# Patient Record
Sex: Male | Born: 1985 | Race: White | Hispanic: No | Marital: Married | Smoking: Never smoker
Health system: Southern US, Community
[De-identification: ages and names within clinical notes are randomized; demographics above are authoritative.]

## PROBLEM LIST (undated history)

## (undated) DIAGNOSIS — K760 Fatty (change of) liver, not elsewhere classified: Secondary | ICD-10-CM

## (undated) DIAGNOSIS — I10 Essential (primary) hypertension: Secondary | ICD-10-CM

## (undated) DIAGNOSIS — F410 Panic disorder [episodic paroxysmal anxiety] without agoraphobia: Secondary | ICD-10-CM

## (undated) DIAGNOSIS — K5100 Ulcerative (chronic) pancolitis without complications: Secondary | ICD-10-CM

## (undated) DIAGNOSIS — K529 Noninfective gastroenteritis and colitis, unspecified: Secondary | ICD-10-CM

## (undated) DIAGNOSIS — E119 Type 2 diabetes mellitus without complications: Secondary | ICD-10-CM

## (undated) HISTORY — DX: Morbid (severe) obesity due to excess calories: E66.01

## (undated) HISTORY — DX: Panic disorder (episodic paroxysmal anxiety): F41.0

## (undated) HISTORY — DX: Type 2 diabetes mellitus without complications: E11.9

## (undated) HISTORY — PX: NO PAST SURGERIES: SHX2092

## (undated) HISTORY — DX: Noninfective gastroenteritis and colitis, unspecified: K52.9

## (undated) HISTORY — DX: Ulcerative (chronic) pancolitis without complications: K51.00

## (undated) HISTORY — DX: Fatty (change of) liver, not elsewhere classified: K76.0

---

## 2014-08-09 DIAGNOSIS — Z79899 Other long term (current) drug therapy: Secondary | ICD-10-CM | POA: Insufficient documentation

## 2015-03-28 DIAGNOSIS — F4001 Agoraphobia with panic disorder: Secondary | ICD-10-CM | POA: Insufficient documentation

## 2015-03-28 DIAGNOSIS — I10 Essential (primary) hypertension: Secondary | ICD-10-CM | POA: Insufficient documentation

## 2016-01-22 ENCOUNTER — Ambulatory Visit: Payer: Self-pay | Admitting: Orthopedic Surgery

## 2016-01-29 ENCOUNTER — Ambulatory Visit: Payer: Self-pay | Admitting: Orthopedic Surgery

## 2016-01-29 NOTE — H&P (Signed)
[name] is an 30 y.o. male.   Chief Complaint: back and L leg pain HPI: The patient is a 30 year old male who presents today for follow up of their back. The patient is being followed for their low back symptoms. They are now 9 1/2 months out from when symptoms began. Symptoms reported today include: pain and foot pain (tingling after sitting in the left foot), while the patient does not report symptoms of: numbness. Current treatment includes: activity modification and Robaxin. The patient presents today following MRI.  He follows up with persistent pain. It radiates into the left foot and leg. He is unable to exercise, take long strides. 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 He is mainly in a sedentary position. This has been going on for over nine-and-a-half months.  Review of systems is negative for fevers, chest pain, shortness of breath, unexplained recent weight loss, loss of bowel or bladder function, burning with urination, joint swelling, rashes, weakness or numbness, difficulty with balance, easy bruising, excessive thirst or frequent urination.  No past medical history on file.  No past surgical history on file.  No family history on file. Social History:  has no tobacco, alcohol, and drug history on file.  Allergies: Allergies not on file   (Not in a hospital admission)  No results found for this or any previous visit (from the past 48 hour(s)). No results found.  Review of Systems  Constitutional: Negative.   HENT: Negative.   Eyes: Negative.   Respiratory: Negative.   Cardiovascular: Negative.   Gastrointestinal: Negative.   Genitourinary: Negative.   Musculoskeletal: Positive for back pain.  Skin: Negative.   Neurological: Positive for sensory change and focal weakness.  Psychiatric/Behavioral: Negative.     There were no vitals taken for this visit. Physical Exam  Constitutional: He is oriented to person, place, and time. He appears well-developed.   HENT:  Head: Normocephalic.  Eyes: Pupils are equal, round, and reactive to light.  Neck: Normal range of motion.  Cardiovascular: Normal rate.   Respiratory: Effort normal.  GI: Soft.  Musculoskeletal:  He is upright, in mild distress. Mood and affect is appropriate. He is tender in the left proximal gluteus. His straight leg raise is buttock, thigh, and calf pain on the left. Straight leg raise on the right produces left buttock pain. EHL is 4+/5 on the left compared to the right. He has limited extension.  Lumbar spine exam reveals no evidence of soft tissue swelling, deformity or skin ecchymosis. On palpation there is no tenderness of the lumbar spine. No flank pain with percussion. The abdomen is soft and nontender. Nontender over the trochanters. No cellulitis or lymphadenopathy.  Motor is 5/5 including EHL, tibialis anterior, plantar flexion, quadriceps and hamstrings. Patient is normoreflexic. There is no Babinski or clonus. Sensory exam is intact to light touch. Patient has good distal pulses. No DVT. No pain and normal range of motion without instability of the hips, knees and ankles.  Neurological: He is alert and oriented to person, place, and time.  Skin: Skin is warm and dry.  Psychiatric: He has a normal mood and affect.    MRI demonstrates a disc herniation as a lowest disc space, which is labeled L4-5 as he has a sacralised lumbar vertebral body. He has congenital spinal stenosis as well, disc degeneration and a small protrusion at L3-4.  Assessment/Plan 1. 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 2. Underlying congenital spinal stenosis. 3. 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 4. Elevated BMI.  I had an extensive discussion with Mr. Pischke concerning his current pathology, relevant anatomy, and treatment  options. Given that he has failed conservative treatment in the presence of a neurologic deficit, significant negative affect of his activities of daily living, we discussed a micro lumbar decompression at L4-5.  I had an extensive discussion of the risks and benefits of the lumbar decompression with the patient including bleeding, infection, damage to neurovascular structures, epidural fibrosis, CSF leak requiring repair. We also discussed increase in pain, adjacent segment disease, recurrent disc herniation, need for future surgery including repeat decompression and/or fusion. We also discussed risks of postoperative hematoma, paralysis, anesthetic complications including DVT, PE, death, cardiopulmonary dysfunction. In addition, the perioperative and postoperative courses were discussed in detail including the rehabilitative time and return to functional activity and work. I provided the patient with an illustrated handout and utilized the appropriate surgical models.  We discussed overnight in the hospital two weeks for suture removal, return to work discussion at two weeks. No history of DVT, PE, or MRSA exposure. He is able to take Kefzol. If he has any change in his symptoms in the interim, he is to call. We discussed this in extensive detail. We reviewed the MRI and his radiographs. He does have disc degeneration on his radiographs. No instability in flexion and extension. We discussed disc pressure management following that specifically the incidence of recurrent disc herniation and progressive spinal stenosis.  Plan microlumbar decompression L4-5  Cecilie Kicks., PA-C for Dr. Tonita Cong 01/29/2016, 3:42 PM

## 2016-02-08 NOTE — Patient Instructions (Addendum)
[name]  02/08/2016   Your procedure is scheduled on: 02/14/16  Report to [facility] Main  Entrance take [place]  elevators to 3rd floor to  [place] at  [place] AM.  Call this number if you have problems the morning of surgery [phone]   Remember: ONLY 1 PERSON MAY GO WITH YOU TO SHORT STAY TO GET  READY MORNING OF [place].  Do not eat food or drink liquids :After Midnight.     Take these med icines the morning of surgery with A SIP OF WATER: Lexapro, Robaxin  DO NOT TAKE ANY DIABETIC MEDICATIONS DAY OF YOUR SURGERY                               You may not have any metal on your body including hair pins and              piercings  Do not wear jewelry, make-up, lotions, powders or perfumes, deodorant             Do not wear nail polish.  Do not shave  48 hours prior to surgery.              Men may shave face and neck.   Do not bring valuables to the hospital. [place].  Contacts, dentures or bridgework may not be worn into surgery.  Leave suitcase in the car. After surgery it may be brought to your room.               - Preparing for Surgery Before surgery, you can play an important role.  Because skin is not sterile, your skin needs to be as free of germs as possible.  You can reduce the number of germs on your skin by washing with CHG (chlorahexidine gluconate) soap before surgery.  CHG is an antiseptic cleaner which kills germs and bonds with the skin to continue killing germs even after washing. Please DO NOT use if you have an allergy to CHG or antibacterial soaps.  If your skin becomes reddened/irritated stop using the CHG and inform your nurse when you arrive at Short Stay. Do not shave (including legs and underarms) for at least 48 hours prior to the first CHG shower.  You may shave your face/neck. Please follow these instructions carefully:  1.  Shower with CHG Soap  the night before surgery and the  morning of Surgery.  2.  If you choose to wash your hair, wash your hair first as usual with your  normal  shampoo.  3.  After you shampoo, rinse your hair and body thoroughly to remove the  shampoo.                           4.  Use CHG as you would any other liquid soap.  You can apply chg directly  to the skin and wash                       Gently with a scrungie or clean washcloth.  5.  Apply the CHG Soap to your body ONLY FROM THE NECK DOWN.   Do not use on  face/ open                           Wound or open sores. Avoid contact with eyes, ears mouth and genitals (private parts).                       Wash face,  Genitals (private parts) with your normal soap.             6.  Wash thoroughly, paying special attention to the area where your surgery  will be performed.  7.  Thoroughly rinse your body with warm water from the neck down.  8.  DO NOT shower/wash with your normal soap after using and rinsing off  the CHG Soap.                9.  Pat yourself dry with a clean towel.            10.  Wear clean pajamas.            11.  Place clean sheets on your bed the night of your first shower and do not  sleep with pets. Day of Surgery : Do not apply any lotions/deodorants the morning of surgery.  Please wear clean clothes to the hospital/surgery center.  FAILURE TO FOLLOW THESE INSTRUCTIONS MAY RESULT IN THE CANCELLATION OF YOUR SURGERY PATIENT SIGNATURE_________________________________  NURSE SIGNATURE__________________________________  ________________________________________________________________________   Adam Phenix  An incentive spirometer is a tool that can help keep your lungs clear and active. This tool measures how well you are filling your lungs with each breath. Taking long deep breaths may help reverse or decrease the chance of developing breathing (pulmonary) problems (especially infection) following:  A long period of time when  you are unable to move or be active. BEFORE THE PROCEDURE   If the spirometer includes an indicator to show your best effort, your nurse or respiratory therapist will set it to a desired goal.  If possible, sit up straight or lean slightly forward. Try not to slouch.  Hold the incentive spirometer in an upright position. INSTRUCTIONS FOR USE  1. Sit on the edge of your bed if possible, or sit up as far as you can in bed or on a chair. 2. Hold the incentive spirometer in an upright position. 3. Breathe out normally. 4. Place the mouthpiece in your mouth and seal your lips tightly around it. 5. Breathe in slowly and as deeply as possible, raising the piston or the ball toward the top of the column. 6. Hold your breath for 3-5 seconds or for as long as possible. Allow the piston or ball to fall to the bottom of the column. 7. Remove the mouthpiece from your mouth and breathe out normally. 8. Rest for a few seconds and repeat Steps 1 through 7 at least 10 times every 1-2 hours when you are awake. Take your time and take a few normal breaths between deep breaths. 9. The spirometer may include an indicator to show your best effort. Use the indicator as a goal to work toward during each repetition. 10. After each set of 10 deep breaths, practice coughing to be sure your lungs are clear. If you have an incision (the cut made at the time of surgery), support your incision when coughing by placing a pillow or rolled up towels firmly against it. Once you are able to get out of bed, walk around indoors  and cough well. You may stop using the incentive spirometer when instructed by your caregiver.  RISKS AND COMPLICATIONS  Take your time so you do not get dizzy or light-headed.  If you are in pain, you may need to take or ask for pain medication before doing incentive spirometry. It is harder to take a deep breath if you are having pain. AFTER USE  Rest and breathe slowly and easily.  It can be  helpful to keep track of a log of your progress. Your caregiver can provide you with a simple table to help with this. If you are using the spirometer at home, follow these instructions: [place] IF:   You are having difficultly using the spirometer.  You have trouble using the spirometer as often as instructed.  Your pain medication is not giving enough relief while using the spirometer.  You develop fever of 100.5 F (38.1 C) or higher. SEEK IMMEDIATE MEDICAL CARE IF:   You cough up bloody sputum that had not been present before.  You develop fever of 102 F (38.9 C) or greater.  You develop worsening pain at or near the incision site. MAKE SURE YOU:   Understand these instructions.  Will watch your condition.  Will get help right away if you are not doing well or get worse. Document Released: 11/11/2006 Document Revised: 09/23/2011 Document Reviewed: 01/12/2007 [facility] Patient Information 2014 [place], [place].   ________________________________________________________________________

## 2016-02-09 ENCOUNTER — Ambulatory Visit (HOSPITAL_COMMUNITY)
Admission: RE | Admit: 2016-02-09 | Discharge: 2016-02-09 | Disposition: A | Discharge status: Home / Self Care | Payer: 59 | Source: Ambulatory Visit | Attending: Orthopedic Surgery | Admitting: Orthopedic Surgery

## 2016-02-09 ENCOUNTER — Encounter (HOSPITAL_COMMUNITY)
Admission: RE | Admit: 2016-02-09 | Discharge: 2016-02-09 | Disposition: A | Discharge status: Home / Self Care | Payer: 59 | Source: Ambulatory Visit | Attending: Specialist | Admitting: Specialist

## 2016-02-09 ENCOUNTER — Encounter (HOSPITAL_COMMUNITY): Payer: Self-pay

## 2016-02-09 DIAGNOSIS — I10 Essential (primary) hypertension: Secondary | ICD-10-CM | POA: Insufficient documentation

## 2016-02-09 DIAGNOSIS — M5126 Other intervertebral disc displacement, lumbar region: Secondary | ICD-10-CM | POA: Diagnosis present

## 2016-02-09 HISTORY — DX: Essential (primary) hypertension: I10

## 2016-02-09 LAB — ABO/RH: ABO/RH(D): A POS

## 2016-02-09 LAB — CBC
HEMATOCRIT: 42.9 % (ref 39.0–52.0)
HEMOGLOBIN: 15.2 g/dL (ref 13.0–17.0)
MCH: 29.6 pg (ref 26.0–34.0)
MCHC: 35.4 g/dL (ref 30.0–36.0)
MCV: 83.6 fL (ref 78.0–100.0)
Platelets: 325 10*3/uL (ref 150–400)
RBC: 5.13 MIL/uL (ref 4.22–5.81)
RDW: 12.2 % (ref 11.5–15.5)
WBC: 7.9 10*3/uL (ref 4.0–10.5)

## 2016-02-09 LAB — BASIC METABOLIC PANEL
ANION GAP: 9 (ref 5–15)
BUN: 17 mg/dL (ref 6–20)
CHLORIDE: 103 mmol/L (ref 101–111)
CO2: 24 mmol/L (ref 22–32)
Calcium: 9.5 mg/dL (ref 8.9–10.3)
Creatinine, Ser: 0.85 mg/dL (ref 0.61–1.24)
GFR calc Af Amer: >60 mL/min (ref >60)
GLUCOSE: 97 mg/dL (ref 65–99)
POTASSIUM: 4.1 mmol/L (ref 3.5–5.1)
Sodium: 136 mmol/L (ref 135–145)

## 2016-02-09 LAB — SURGICAL PCR SCREEN
MRSA, PCR: NEGATIVE
STAPHYLOCOCCUS AUREUS: NEGATIVE

## 2016-02-13 MED ORDER — DEXTROSE 5 % IV SOLN
3.0000 g | INTRAVENOUS | Status: AC
  Administered 2016-02-14: 3 g via INTRAVENOUS
  Filled 2016-02-13: qty 3

## 2016-02-13 NOTE — Anesthesia Preprocedure Evaluation (Signed)
Anesthesia Evaluation  Patient identified by MRN, date of birth, ID band Patient awake    Reviewed: Allergy & Precautions, H&P , NPO status , Patient's Chart, lab work & pertinent test results  Airway Mallampati: II  TM Distance: >3 FB Neck ROM: full    Dental no notable dental hx. (+) Dental Advisory Given, Teeth Intact   Pulmonary neg pulmonary ROS,    Pulmonary exam normal breath sounds clear to auscultation       Cardiovascular hypertension, On Medications Normal cardiovascular exam Rhythm:regular Rate:Normal     Neuro/Psych negative neurological ROS  negative psych ROS   GI/Hepatic negative GI ROS, Neg liver ROS,   Endo/Other  negative endocrine ROS  Renal/GU negative Renal ROS  negative genitourinary   Musculoskeletal   Abdominal   Peds  Hematology negative hematology ROS (+)   Anesthesia Other Findings   Reproductive/Obstetrics negative OB ROS                             Anesthesia Physical Anesthesia Plan  ASA: II  Anesthesia Plan: General   Post-op Pain Management:    Induction: Intravenous  Airway Management Planned: Oral ETT  Additional Equipment:   Intra-op Plan:   Post-operative Plan: Extubation in OR  Informed Consent: I have reviewed the patients History and Physical, chart, labs and discussed the procedure including the risks, benefits and alternatives for the proposed anesthesia with the patient or authorized representative who has indicated his/her understanding and acceptance.   Dental Advisory Given  Plan Discussed with: CRNA  Anesthesia Plan Comments:         Anesthesia Quick Evaluation

## 2016-02-14 ENCOUNTER — Ambulatory Visit (HOSPITAL_COMMUNITY): Payer: 59

## 2016-02-14 ENCOUNTER — Ambulatory Visit (HOSPITAL_COMMUNITY): Payer: 59 | Admitting: Anesthesiology

## 2016-02-14 ENCOUNTER — Ambulatory Visit (HOSPITAL_COMMUNITY)
Admission: RE | Admit: 2016-02-14 | Discharge: 2016-02-15 | Disposition: A | Discharge status: Home / Self Care | Payer: 59 | Source: Ambulatory Visit | Attending: Specialist | Admitting: Specialist

## 2016-02-14 ENCOUNTER — Encounter (HOSPITAL_COMMUNITY)
Admission: RE | Disposition: A | Discharge status: Home / Self Care | Payer: Self-pay | Source: Ambulatory Visit | Attending: Specialist

## 2016-02-14 ENCOUNTER — Encounter (HOSPITAL_COMMUNITY): Payer: Self-pay | Admitting: *Deleted

## 2016-02-14 DIAGNOSIS — M5116 Intervertebral disc disorders with radiculopathy, lumbar region: Secondary | ICD-10-CM | POA: Diagnosis present

## 2016-02-14 DIAGNOSIS — I10 Essential (primary) hypertension: Secondary | ICD-10-CM | POA: Diagnosis not present

## 2016-02-14 DIAGNOSIS — Q7649 Other congenital malformations of spine, not associated with scoliosis: Secondary | ICD-10-CM | POA: Insufficient documentation

## 2016-02-14 DIAGNOSIS — Z79899 Other long term (current) drug therapy: Secondary | ICD-10-CM | POA: Insufficient documentation

## 2016-02-14 DIAGNOSIS — M5126 Other intervertebral disc displacement, lumbar region: Secondary | ICD-10-CM

## 2016-02-14 DIAGNOSIS — Z419 Encounter for procedure for purposes other than remedying health state, unspecified: Secondary | ICD-10-CM

## 2016-02-14 DIAGNOSIS — F410 Panic disorder [episodic paroxysmal anxiety] without agoraphobia: Secondary | ICD-10-CM | POA: Diagnosis not present

## 2016-02-14 DIAGNOSIS — M48061 Spinal stenosis, lumbar region without neurogenic claudication: Secondary | ICD-10-CM | POA: Diagnosis present

## 2016-02-14 HISTORY — PX: LUMBAR LAMINECTOMY/DECOMPRESSION MICRODISCECTOMY: SHX5026

## 2016-02-14 LAB — TYPE AND SCREEN
ABO/RH(D): A POS
ANTIBODY SCREEN: NEGATIVE

## 2016-02-14 SURGERY — LUMBAR LAMINECTOMY/DECOMPRESSION MICRODISCECTOMY 1 LEVEL
Anesthesia: General | Site: Back

## 2016-02-14 MED ORDER — HYDROMORPHONE HCL 1 MG/ML IJ SOLN
0.2500 mg | INTRAMUSCULAR | Status: DC | PRN
  Administered 2016-02-14: 0.5 mg via INTRAVENOUS

## 2016-02-14 MED ORDER — RISAQUAD PO CAPS
1.0000 | ORAL_CAPSULE | Freq: Every day | ORAL | Status: DC
Start: 2016-02-14 — End: 2016-02-15
  Administered 2016-02-14 – 2016-02-15 (×2): 1 via ORAL
  Filled 2016-02-14 (×2): qty 1

## 2016-02-14 MED ORDER — HYDROMORPHONE HCL 1 MG/ML IJ SOLN
INTRAMUSCULAR | Status: DC | PRN
  Administered 2016-02-14 (×3): .4 mg via INTRAVENOUS

## 2016-02-14 MED ORDER — OXYCODONE-ACETAMINOPHEN 7.5-325 MG PO TABS: 1.0000 | ORAL_TABLET | ORAL | 0 refills | Status: DC | PRN

## 2016-02-14 MED ORDER — SUGAMMADEX SODIUM 200 MG/2ML IV SOLN
INTRAVENOUS | Status: DC | PRN
  Administered 2016-02-14: 300 mg via INTRAVENOUS

## 2016-02-14 MED ORDER — KCL IN DEXTROSE-NACL 20-5-0.45 MEQ/L-%-% IV SOLN
INTRAVENOUS | Status: DC
  Administered 2016-02-14: 13:00:00 via INTRAVENOUS
  Filled 2016-02-14 (×2): qty 1000

## 2016-02-14 MED ORDER — PROPOFOL 10 MG/ML IV BOLUS
INTRAVENOUS | Status: AC
  Filled 2016-02-14: qty 20

## 2016-02-14 MED ORDER — ROCURONIUM BROMIDE 100 MG/10ML IV SOLN
INTRAVENOUS | Status: AC
  Filled 2016-02-14: qty 1

## 2016-02-14 MED ORDER — LACTATED RINGERS IV SOLN: INTRAVENOUS | Status: DC

## 2016-02-14 MED ORDER — ESCITALOPRAM OXALATE 10 MG PO TABS
10.0000 mg | ORAL_TABLET | Freq: Every day | ORAL | Status: DC
  Administered 2016-02-15: 10 mg via ORAL
  Filled 2016-02-14: qty 1

## 2016-02-14 MED ORDER — HYDROCODONE-ACETAMINOPHEN 5-325 MG PO TABS
1.0000 | ORAL_TABLET | ORAL | Status: DC | PRN
  Administered 2016-02-15 (×2): 2 via ORAL
  Filled 2016-02-14 (×2): qty 2

## 2016-02-14 MED ORDER — LACTATED RINGERS IV SOLN
INTRAVENOUS | Status: DC
  Administered 2016-02-14: 08:00:00 via INTRAVENOUS

## 2016-02-14 MED ORDER — HYDROMORPHONE HCL 2 MG/ML IJ SOLN
INTRAMUSCULAR | Status: AC
  Filled 2016-02-14: qty 1

## 2016-02-14 MED ORDER — DEXAMETHASONE SODIUM PHOSPHATE 10 MG/ML IJ SOLN
INTRAMUSCULAR | Status: AC
  Filled 2016-02-14: qty 1

## 2016-02-14 MED ORDER — HYDROMORPHONE HCL 1 MG/ML IJ SOLN
INTRAMUSCULAR | Status: AC
  Filled 2016-02-14: qty 1

## 2016-02-14 MED ORDER — ACETAMINOPHEN 10 MG/ML IV SOLN
INTRAVENOUS | Status: AC
  Filled 2016-02-14: qty 100

## 2016-02-14 MED ORDER — BUPIVACAINE-EPINEPHRINE 0.5% -1:200000 IJ SOLN
INTRAMUSCULAR | Status: DC | PRN
  Administered 2016-02-14: 15 mL

## 2016-02-14 MED ORDER — ALUM & MAG HYDROXIDE-SIMETH 200-200-20 MG/5ML PO SUSP: 30.0000 mL | Freq: Four times a day (QID) | ORAL | Status: DC | PRN

## 2016-02-14 MED ORDER — OXYCODONE-ACETAMINOPHEN 5-325 MG PO TABS
1.0000 | ORAL_TABLET | ORAL | Status: DC | PRN
  Administered 2016-02-14 (×2): 1 via ORAL
  Filled 2016-02-14 (×2): qty 1

## 2016-02-14 MED ORDER — METHOCARBAMOL 500 MG PO TABS: 500.0000 mg | ORAL_TABLET | Freq: Four times a day (QID) | ORAL | 1 refills | Status: DC | PRN

## 2016-02-14 MED ORDER — ACETAMINOPHEN 10 MG/ML IV SOLN
1000.0000 mg | Freq: Once | INTRAVENOUS | Status: AC
  Administered 2016-02-14: 1000 mg via INTRAVENOUS

## 2016-02-14 MED ORDER — MENTHOL 3 MG MT LOZG: 1.0000 | LOZENGE | OROMUCOSAL | Status: DC | PRN

## 2016-02-14 MED ORDER — ONDANSETRON HCL 4 MG/2ML IJ SOLN
INTRAMUSCULAR | Status: AC
  Filled 2016-02-14: qty 2

## 2016-02-14 MED ORDER — POLYMYXIN B SULFATE 500000 UNITS IJ SOLR
INTRAMUSCULAR | Status: AC
  Filled 2016-02-14: qty 1

## 2016-02-14 MED ORDER — DOCUSATE SODIUM 100 MG PO CAPS
100.0000 mg | ORAL_CAPSULE | Freq: Two times a day (BID) | ORAL | 1 refills | Status: DC | PRN
Start: 2016-02-14 — End: 2016-05-24

## 2016-02-14 MED ORDER — HYDROMORPHONE HCL 1 MG/ML IJ SOLN: 0.5000 mg | INTRAMUSCULAR | Status: DC | PRN

## 2016-02-14 MED ORDER — MIDAZOLAM HCL 5 MG/5ML IJ SOLN
INTRAMUSCULAR | Status: DC | PRN
  Administered 2016-02-14: 2 mg via INTRAVENOUS

## 2016-02-14 MED ORDER — LIDOCAINE HCL (CARDIAC) 20 MG/ML IV SOLN
INTRAVENOUS | Status: DC | PRN
  Administered 2016-02-14: 100 mg via INTRATRACHEAL

## 2016-02-14 MED ORDER — FENTANYL CITRATE (PF) 250 MCG/5ML IJ SOLN
INTRAMUSCULAR | Status: AC
  Filled 2016-02-14: qty 5

## 2016-02-14 MED ORDER — METHOCARBAMOL 500 MG PO TABS: 500.0000 mg | ORAL_TABLET | Freq: Four times a day (QID) | ORAL | Status: DC | PRN

## 2016-02-14 MED ORDER — ACETAMINOPHEN 650 MG RE SUPP: 650.0000 mg | RECTAL | Status: DC | PRN

## 2016-02-14 MED ORDER — ONDANSETRON HCL 4 MG/2ML IJ SOLN
INTRAMUSCULAR | Status: DC | PRN
  Administered 2016-02-14: 4 mg via INTRAVENOUS

## 2016-02-14 MED ORDER — ACETAMINOPHEN 325 MG PO TABS
650.0000 mg | ORAL_TABLET | ORAL | Status: DC | PRN
Start: 2016-02-14 — End: 2016-02-15

## 2016-02-14 MED ORDER — PROPOFOL 10 MG/ML IV BOLUS
INTRAVENOUS | Status: DC | PRN
  Administered 2016-02-14: 100 mg via INTRAVENOUS
  Administered 2016-02-14: 250 mg via INTRAVENOUS

## 2016-02-14 MED ORDER — FENTANYL CITRATE (PF) 250 MCG/5ML IJ SOLN
INTRAMUSCULAR | Status: DC | PRN
  Administered 2016-02-14: 50 ug via INTRAVENOUS
  Administered 2016-02-14 (×2): 100 ug via INTRAVENOUS

## 2016-02-14 MED ORDER — SUGAMMADEX SODIUM 200 MG/2ML IV SOLN
INTRAVENOUS | Status: AC
  Filled 2016-02-14: qty 2

## 2016-02-14 MED ORDER — MIDAZOLAM HCL 2 MG/2ML IJ SOLN
INTRAMUSCULAR | Status: AC
  Filled 2016-02-14: qty 2

## 2016-02-14 MED ORDER — DEXAMETHASONE SODIUM PHOSPHATE 10 MG/ML IJ SOLN
INTRAMUSCULAR | Status: DC | PRN
  Administered 2016-02-14: 10 mg via INTRAVENOUS

## 2016-02-14 MED ORDER — DOCUSATE SODIUM 100 MG PO CAPS
100.0000 mg | ORAL_CAPSULE | Freq: Two times a day (BID) | ORAL | Status: DC
  Administered 2016-02-14 – 2016-02-15 (×2): 100 mg via ORAL
  Filled 2016-02-14 (×2): qty 1

## 2016-02-14 MED ORDER — POLYETHYLENE GLYCOL 3350 17 G PO PACK: 17.0000 g | PACK | Freq: Every day | ORAL | Status: DC | PRN

## 2016-02-14 MED ORDER — MAGNESIUM CITRATE PO SOLN: 1.0000 | Freq: Once | ORAL | Status: DC | PRN

## 2016-02-14 MED ORDER — BISACODYL 5 MG PO TBEC
5.0000 mg | DELAYED_RELEASE_TABLET | Freq: Every day | ORAL | Status: DC | PRN
Start: 2016-02-14 — End: 2016-02-15

## 2016-02-14 MED ORDER — CEFAZOLIN SODIUM-DEXTROSE 2-4 GM/100ML-% IV SOLN
2.0000 g | Freq: Three times a day (TID) | INTRAVENOUS | Status: AC
Start: 2016-02-14 — End: 2016-02-15
  Administered 2016-02-14 – 2016-02-15 (×2): 2 g via INTRAVENOUS
  Filled 2016-02-14 (×2): qty 100

## 2016-02-14 MED ORDER — BUPIVACAINE-EPINEPHRINE (PF) 0.5% -1:200000 IJ SOLN
INTRAMUSCULAR | Status: AC
  Filled 2016-02-14: qty 30

## 2016-02-14 MED ORDER — THROMBIN 5000 UNITS EX SOLR
CUTANEOUS | Status: AC
  Filled 2016-02-14: qty 10000

## 2016-02-14 MED ORDER — POLYETHYLENE GLYCOL 3350 17 G PO PACK: 17.0000 g | PACK | Freq: Every day | ORAL | 0 refills | Status: DC

## 2016-02-14 MED ORDER — PHENOL 1.4 % MT LIQD: 1.0000 | OROMUCOSAL | Status: DC | PRN

## 2016-02-14 MED ORDER — ONDANSETRON HCL 4 MG/2ML IJ SOLN: 4.0000 mg | INTRAMUSCULAR | Status: DC | PRN

## 2016-02-14 MED ORDER — LIDOCAINE HCL (CARDIAC) 20 MG/ML IV SOLN
INTRAVENOUS | Status: AC
  Filled 2016-02-14: qty 5

## 2016-02-14 MED ORDER — METHOCARBAMOL 1000 MG/10ML IJ SOLN
500.0000 mg | Freq: Four times a day (QID) | INTRAVENOUS | Status: DC | PRN
  Administered 2016-02-14: 500 mg via INTRAVENOUS
  Filled 2016-02-14: qty 550
  Filled 2016-02-14: qty 5

## 2016-02-14 MED ORDER — SODIUM CHLORIDE 0.9 % IR SOLN
Status: DC | PRN
  Administered 2016-02-14: 500 mL

## 2016-02-14 MED ORDER — ROCURONIUM BROMIDE 100 MG/10ML IV SOLN
INTRAVENOUS | Status: DC | PRN
  Administered 2016-02-14: 10 mg via INTRAVENOUS
  Administered 2016-02-14: 50 mg via INTRAVENOUS
  Administered 2016-02-14: 20 mg via INTRAVENOUS
  Administered 2016-02-14: 10 mg via INTRAVENOUS

## 2016-02-14 MED ORDER — LABETALOL HCL 5 MG/ML IV SOLN
INTRAVENOUS | Status: AC
  Filled 2016-02-14: qty 4

## 2016-02-14 SURGICAL SUPPLY — 48 items
BAG ZIPLOCK 12X15 (MISCELLANEOUS) IMPLANT
CLEANER TIP ELECTROSURG 2X2 (MISCELLANEOUS) ×2 IMPLANT
CLOTH 2% CHLOROHEXIDINE 3PK (PERSONAL CARE ITEMS) ×2 IMPLANT
DRAPE MICROSCOPE LEICA (MISCELLANEOUS) ×2 IMPLANT
DRAPE POUCH INSTRU U-SHP 10X18 (DRAPES) ×2 IMPLANT
DRAPE SHEET LG 3/4 BI-LAMINATE (DRAPES) ×2 IMPLANT
DRAPE SURG 17X11 SM STRL (DRAPES) ×2 IMPLANT
DRAPE UTILITY XL STRL (DRAPES) ×2 IMPLANT
DRSG AQUACEL AG ADV 3.5X 4 (GAUZE/BANDAGES/DRESSINGS) ×2 IMPLANT
DRSG AQUACEL AG ADV 3.5X 6 (GAUZE/BANDAGES/DRESSINGS) IMPLANT
DURAPREP 26ML APPLICATOR (WOUND CARE) ×2 IMPLANT
DURASEAL SPINE SEALANT 3ML (MISCELLANEOUS) IMPLANT
ELECT BLADE TIP CTD 4 INCH (ELECTRODE) IMPLANT
ELECT REM PT RETURN 9FT ADLT (ELECTROSURGICAL) ×2
ELECTRODE REM PT RTRN 9FT ADLT (ELECTROSURGICAL) ×1 IMPLANT
GLOVE BIOGEL PI IND STRL 7.0 (GLOVE) ×1 IMPLANT
GLOVE BIOGEL PI INDICATOR 7.0 (GLOVE) ×1
GLOVE SURG SS PI 7.0 STRL IVOR (GLOVE) ×2 IMPLANT
GLOVE SURG SS PI 7.5 STRL IVOR (GLOVE) ×2 IMPLANT
GLOVE SURG SS PI 8.0 STRL IVOR (GLOVE) ×4 IMPLANT
GOWN STRL REUS W/TWL XL LVL3 (GOWN DISPOSABLE) ×4 IMPLANT
HEMOSTAT SPONGE AVITENE ULTRA (HEMOSTASIS) IMPLANT
IV CATH 14GX2 1/4 (CATHETERS) ×2 IMPLANT
KIT BASIN OR (CUSTOM PROCEDURE TRAY) ×2 IMPLANT
KIT POSITIONING SURG ANDREWS (MISCELLANEOUS) ×2 IMPLANT
MANIFOLD NEPTUNE II (INSTRUMENTS) ×2 IMPLANT
NEEDLE SCORPION MULTI FIRE (NEEDLE) ×2 IMPLANT
NEEDLE SPNL 18GX3.5 QUINCKE PK (NEEDLE) ×4 IMPLANT
PACK LAMINECTOMY ORTHO (CUSTOM PROCEDURE TRAY) ×2 IMPLANT
PATTIES SURGICAL .5 X.5 (GAUZE/BANDAGES/DRESSINGS) IMPLANT
PATTIES SURGICAL .75X.75 (GAUZE/BANDAGES/DRESSINGS) IMPLANT
PATTIES SURGICAL 1X1 (DISPOSABLE) IMPLANT
RUBBERBAND STERILE (MISCELLANEOUS) ×4 IMPLANT
SPONGE SURGIFOAM ABS GEL 100 (HEMOSTASIS) IMPLANT
STAPLER VISISTAT (STAPLE) IMPLANT
STRIP CLOSURE SKIN 1/2X4 (GAUZE/BANDAGES/DRESSINGS) ×2 IMPLANT
SUT NURALON 4 0 TR CR/8 (SUTURE) IMPLANT
SUT PROLENE 3 0 PS 2 (SUTURE) ×2 IMPLANT
SUT VIC AB 1 CT1 27 (SUTURE) ×1
SUT VIC AB 1 CT1 27XBRD ANTBC (SUTURE) ×1 IMPLANT
SUT VIC AB 1-0 CT2 27 (SUTURE) ×4 IMPLANT
SUT VIC AB 2-0 CT1 27 (SUTURE)
SUT VIC AB 2-0 CT1 TAPERPNT 27 (SUTURE) IMPLANT
SUT VIC AB 2-0 CT2 27 (SUTURE) ×2 IMPLANT
SYR 3ML LL SCALE MARK (SYRINGE) IMPLANT
TOWEL OR 17X26 10 PK STRL BLUE (TOWEL DISPOSABLE) ×2 IMPLANT
TOWEL OR NON WOVEN STRL DISP B (DISPOSABLE) IMPLANT
YANKAUER SUCT BULB TIP NO VENT (SUCTIONS) IMPLANT

## 2016-02-14 NOTE — Anesthesia Postprocedure Evaluation (Signed)
Anesthesia Post Note  Patient: [name]  Procedure(s) Performed: Procedure(s) (LRB): MICRO  LUMBER L4-5 (N/A)  Patient location during evaluation: PACU Anesthesia Type: General Level of consciousness: awake and alert Pain management: pain level controlled Vital Signs Assessment: post-procedure vital signs reviewed and stable Respiratory status: spontaneous breathing, nonlabored ventilation, respiratory function stable and patient connected to nasal cannula oxygen Cardiovascular status: blood pressure returned to baseline and stable Postop Assessment: no signs of nausea or vomiting Anesthetic complications: no    Last Vitals:  Vitals:   02/14/16 1216 02/14/16 1315  BP: (!) 148/86 139/81  Pulse: 89 87  Resp:  18  Temp: 37.1 C 36.9 C    Last Pain:  Vitals:   02/14/16 1315  TempSrc: Oral  PainSc:                  Reinhold Rickey L

## 2016-02-14 NOTE — Anesthesia Procedure Notes (Signed)
Procedure Name: Intubation Date/Time: 02/14/2016 8:33 AM Performed by: Dione Booze Pre-anesthesia Checklist: Patient identified, Emergency Drugs available, Suction available and Patient being monitored Patient Re-evaluated:Patient Re-evaluated prior to inductionOxygen Delivery Method: Circle system utilized Preoxygenation: Pre-oxygenation with 100% oxygen Intubation Type: IV induction Ventilation: Mask ventilation without difficulty and Oral airway inserted - appropriate to patient size Laryngoscope Size: Mac and 4 Grade View: Grade II Tube type: Oral Tube size: 7.5 mm Number of attempts: 1 Airway Equipment and Method: Stylet Placement Confirmation: ETT inserted through vocal cords under direct vision,  positive ETCO2 and breath sounds checked- equal and bilateral Secured at: 22 cm Tube secured with: Tape Dental Injury: Teeth and Oropharynx as per pre-operative assessment

## 2016-02-14 NOTE — Op Note (Signed)
NAMEWATARU, MCCOWEN NO.:  [id number]  MEDICAL RECORD NO.:  [id number]  LOCATION:                               FACILITY:  [facility]  PHYSICIAN:  Susa Day, M.D.    DATE OF BIRTH:  1985-10-29  DATE OF PROCEDURE:  02/14/2016 DATE OF DISCHARGE:  02/15/2016                              OPERATIVE REPORT   PREOPERATIVE DIAGNOSES: 1. Spinal stenosis. 2. 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 3. Elevated BMI.  POSTOPERATIVE DIAGNOSES: 1. Spinal stenosis. 2. 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 3. Elevated BMI.  PROCEDURE PERFORMED: 1. Microlumbar decompression at L4-L5, left. 2. Foraminotomy at L4-L5, left. 3. Microdiskectomy at L4-L5, left. Technical difficulty increased due to the patient's elevated BMI of 40.  ASSISTANT:  Cleophas Dunker, PA.  HISTORY:  This is a 30 year old male with left lower extremity radicular pain secondary to disk herniation, spinal stenosis/congenital, compressing L4-L5 root, neural tension signs, EHL, weakness, indicated for microlumbar decompression at L4-L5.  Risks and benefits were discussed including bleeding, infection, damage to neurovascular structures, no change in symptoms, worsening symptoms, DVT, PE, anesthetic complications, etc.  TECHNIQUE:  With the patient in supine position, after induction of adequate general anesthesia, 3 g Kefzol, placed prone on the [place] frame.  All bony prominences were well padded.  Lumbar region was prepped and draped in usual sterile fashion.  Two 18-gauge spinal needle was utilized to localize the L4-L5 interspace, which was the last open disk space.  He had rudimentary 5-1.  Incision was made from the spinous process L4-L5.  Subcutaneous tissue was dissected.  Electrocautery was utilized to achieve hemostasis.  Fairly deep subcutaneous adipose tissue, which required separate McCullough retractor to get down to the fascia.  The fascia was identified and divided in line  with the skin incision.  After infiltrating with 0.25% Marcaine with epinephrine, paraspinous muscles were elevated from lamina of 5-1.  Extra long retractors were utilized.  Operating microscope was draped and brought on the surgical field.  Confirmatory radiograph obtained. Hemilaminotomy of the caudad edge of L4 was performed with 3-mm Kerrison preserving the pars.  Detached ligamentum flavum.  Ligamentum flavum was detached from the cephalad edge of 5, utilizing a straight micro curette, patty placed beneath the ligamentum flavum.  Generous foraminotomy of 5 was performed.  Identified the 5 root gently mobilized immediately was compressed and lateral recessed secondary to facet hypertrophy, stenosis, and disk herniation.  Decompressed lateral recess to the medial border of the pedicle, performed a foraminotomy of 4 as well.  We identified a focal HNP.  Bipolar electrocautery was utilized to achieve hemostasis.  I performed an annulotomy.  Copious portion of disk material was removed from the disk space with straight and upbiting pituitary, further mobilized with an Epstein and nerve hook.  Multiple fragments were retrieved as well as the extruded fragment caudad to the disk space.  Copiously lavaged the disk space with antibiotic irrigation and retrieved 2 additional fragments.  Confirmatory radiograph obtained at 4-5 with a Penfield 4. Neural probe passed freely at the foramen of 4 and 5.  1 cm of excursion of the 5 root, medial pedicle without tension. No CSF leakage or active bleeding.  We removed the [facility] retractor. We carefully irrigated the paraspinous musculature.  Dorsolumbar fascia reapproximated with 1 Vicryl.  Given fairly deep, required multiple configurations of the retractors to gain exposure.  Subcu with multiple 2-0 closure layers and skin with subcuticular closed with Prolene. Sterile dressing was applied.  Placed supine on the hospital bed, extubated without  difficulty, and transported to the recovery room in satisfactory condition.  ESTIMATED BLOOD LOSS:  Minimal blood loss.  Cleophas Dunker, PA was used throughout the case in patient's positioning, closure, gentle intermittent neural traction, etc.     Susa Day, M.D.     Geralynn Rile  D:  02/14/2016  T:  02/14/2016  Job:  [id number]

## 2016-02-14 NOTE — Interval H&P Note (Signed)
History and Physical Interval Note:  02/14/2016 7:55 AM  [name]  has presented today for surgery, with the diagnosis of STENOSIS HNP L4-5  The various methods of treatment have been discussed with the patient and family. After consideration of risks, benefits and other options for treatment, the patient has consented to  Procedure(s): MICRO  LUMBER L4-5 (N/A) as a surgical intervention .  The patient's history has been reviewed, patient examined, no change in status, stable for surgery.  I have reviewed the patient's chart and labs.  Questions were answered to the patient's satisfaction.     [name] C

## 2016-02-14 NOTE — Discharge Instructions (Signed)
Walk As Tolerated utilizing back precautions.  No bending, twisting, or lifting.  No driving for 2 weeks.   Aquacel dressing may remain in place until follow up. May shower with aquacel dressing in place. If the dressing peels off or becomes saturated, you may remove aquacel dressing and place gauze and tape dressing which should be kept clean and dry and changed daily. Do not remove steri-strips if they are present. See Dr. Tonita Cong in office in 10 to 14 days. Begin taking aspirin 45m per day starting 4 days after your surgery if not allergic to aspirin or on another blood thinner. Walk daily even outside. Use a cane or walker only if necessary. Avoid sitting on soft sofas.

## 2016-02-14 NOTE — Brief Op Note (Signed)
02/14/2016  10:14 AM  PATIENT:  [name]  29 y.o. male  PRE-OPERATIVE DIAGNOSIS:  STENOSIS HNP L4-5  POST-OPERATIVE DIAGNOSIS:  STENOSIS HNP L4-5  PROCEDURE:  Procedure(s): MICRO  LUMBER L4-5 (N/A)  SURGEON:  Surgeon(s) and Role:    * Susa Day, MD - Primary  PHYSICIAN ASSISTANT:   ASSISTANTS: Biss[place]   ANESTHESIA:   general  EBL:  Total I/O In: -  Out: 50 [Blood:50]  BLOOD ADMINISTERED:none  DRAINS: none   LOCAL MEDICATIONS USED:  MARCAINE     SPECIMEN:  Source of Specimen:  [id number]  DISPOSITION OF SPECIMEN:  PATHOLOGY  COUNTS:  YES  TOURNIQUET:  * No tourniquets in log *  DICTATION: .Other Dictation: Dictation Number [id number]  PLAN OF CARE: Admit for overnight observation  PATIENT DISPOSITION:  PACU - hemodynamically stable.   Delay start of Pharmacological VTE agent (>24hrs) due to surgical blood loss or risk of bleeding: yes

## 2016-02-14 NOTE — H&P (View-Only) (Signed)
[name] is an 30 y.o. male.   Chief Complaint: back and L leg pain HPI: The patient is a 30 year old male who presents today for follow up of their back. The patient is being followed for their low back symptoms. They are now 9 1/2 months out from when symptoms began. Symptoms reported today include: pain and foot pain (tingling after sitting in the left foot), while the patient does not report symptoms of: numbness. Current treatment includes: activity modification and Robaxin. The patient presents today following MRI.  He follows up with persistent pain. It radiates into the left foot and leg. He is unable to exercise, take long strides. 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 He is mainly in a sedentary position. This has been going on for over nine-and-a-half months.  Review of systems is negative for fevers, chest pain, shortness of breath, unexplained recent weight loss, loss of bowel or bladder function, burning with urination, joint swelling, rashes, weakness or numbness, difficulty with balance, easy bruising, excessive thirst or frequent urination.  No past medical history on file.  No past surgical history on file.  No family history on file. Social History:  has no tobacco, alcohol, and drug history on file.  Allergies: Allergies not on file   (Not in a hospital admission)  No results found for this or any previous visit (from the past 48 hour(s)). No results found.  Review of Systems  Constitutional: Negative.   HENT: Negative.   Eyes: Negative.   Respiratory: Negative.   Cardiovascular: Negative.   Gastrointestinal: Negative.   Genitourinary: Negative.   Musculoskeletal: Positive for back pain.  Skin: Negative.   Neurological: Positive for sensory change and focal weakness.  Psychiatric/Behavioral: Negative.     There were no vitals taken for this visit. Physical Exam  Constitutional: He is oriented to person, place, and time. He appears well-developed.   HENT:  Head: Normocephalic.  Eyes: Pupils are equal, round, and reactive to light.  Neck: Normal range of motion.  Cardiovascular: Normal rate.   Respiratory: Effort normal.  GI: Soft.  Musculoskeletal:  He is upright, in mild distress. Mood and affect is appropriate. He is tender in the left proximal gluteus. His straight leg raise is buttock, thigh, and calf pain on the left. Straight leg raise on the right produces left buttock pain. EHL is 4+/5 on the left compared to the right. He has limited extension.  Lumbar spine exam reveals no evidence of soft tissue swelling, deformity or skin ecchymosis. On palpation there is no tenderness of the lumbar spine. No flank pain with percussion. The abdomen is soft and nontender. Nontender over the trochanters. No cellulitis or lymphadenopathy.  Motor is 5/5 including EHL, tibialis anterior, plantar flexion, quadriceps and hamstrings. Patient is normoreflexic. There is no Babinski or clonus. Sensory exam is intact to light touch. Patient has good distal pulses. No DVT. No pain and normal range of motion without instability of the hips, knees and ankles.  Neurological: He is alert and oriented to person, place, and time.  Skin: Skin is warm and dry.  Psychiatric: He has a normal mood and affect.    MRI demonstrates a disc herniation as a lowest disc space, which is labeled L4-5 as he has a sacralised lumbar vertebral body. He has congenital spinal stenosis as well, disc degeneration and a small protrusion at L3-4.  Assessment/Plan 1. 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 2. Underlying congenital spinal stenosis. 3. 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 4. Elevated BMI.  I had an extensive discussion with [name] concerning his current pathology, relevant anatomy, and treatment  options. Given that he has failed conservative treatment in the presence of a neurologic deficit, significant negative affect of his activities of daily living, we discussed a micro lumbar decompression at L4-5.  I had an extensive discussion of the risks and benefits of the lumbar decompression with the patient including bleeding, infection, damage to neurovascular structures, epidural fibrosis, CSF leak requiring repair. We also discussed increase in pain, adjacent segment disease, recurrent disc herniation, need for future surgery including repeat decompression and/or fusion. We also discussed risks of postoperative hematoma, paralysis, anesthetic complications including DVT, PE, death, cardiopulmonary dysfunction. In addition, the perioperative and postoperative courses were discussed in detail including the rehabilitative time and return to functional activity and work. I provided the patient with an illustrated handout and utilized the appropriate surgical models.  We discussed overnight in the hospital two weeks for suture removal, return to work discussion at two weeks. No history of DVT, PE, or MRSA exposure. He is able to take Kefzol. If he has any change in his symptoms in the interim, he is to call. We discussed this in extensive detail. We reviewed the MRI and his radiographs. He does have disc degeneration on his radiographs. No instability in flexion and extension. We discussed disc pressure management following that specifically the incidence of recurrent disc herniation and progressive spinal stenosis.  Plan microlumbar decompression L4-5  Cecilie Kicks., PA-C for Dr. Tonita Cong 01/29/2016, 3:42 PM

## 2016-02-14 NOTE — Transfer of Care (Signed)
Immediate Anesthesia Transfer of Care Note  Patient: [name]  Procedure(s) Performed: Procedure(s): MICRO  LUMBER L4-5 (N/A)  Patient Location: PACU  Anesthesia Type:General  Level of Consciousness: awake, alert , oriented and patient cooperative  Airway & Oxygen Therapy: Patient Spontanous Breathing and Patient connected to face mask oxygen  Post-op Assessment: Report given to RN and Post -op Vital signs reviewed and stable  Post vital signs: Reviewed and stable  Last Vitals:  Vitals:   02/14/16 0648  BP: (!) 156/93  Pulse: 95  Resp: 18  Temp: 36.7 C    Last Pain:  Vitals:   02/14/16 0648  TempSrc: Oral  PainSc:       Patients Stated Pain Goal: 4 (66/81/59 4707)  Complications: No apparent anesthesia complications

## 2016-02-14 NOTE — Op Note (Deleted)
  The note originally documented on this encounter has been moved the the encounter in which it belongs.  

## 2016-02-15 DIAGNOSIS — M5116 Intervertebral disc disorders with radiculopathy, lumbar region: Secondary | ICD-10-CM | POA: Diagnosis not present

## 2016-02-15 NOTE — Evaluation (Signed)
Occupational Therapy Evaluation Patient Details Name: [name] MRN: [id number] DOB: [birth year] Today's Date: 02/15/2016    History of Present Illness s/p L4-5 decompression   Clinical Impression   This 30 year old man was admitted for the above sx.  All education was completed. No further OT is needed at this time    Follow Up Recommendations  Supervision/Assistance - 24 hour    Equipment Recommendations  None recommended by OT    Recommendations for Other Services       Precautions / Restrictions Precautions Precautions: Fall Restrictions Weight Bearing Restrictions: No      Mobility Bed Mobility Overal bed mobility: Modified Independent                Transfers Overall transfer level: Needs assistance Equipment used: None Transfers: Sit to/from Stand Sit to Stand: Supervision         General transfer comment: cues to power up with legs and keep back straight    Balance                                            ADL Overall ADL's : Needs assistance/impaired     Grooming: Supervision/safety;Standing       Lower Body Bathing: Moderate assistance;Sit to/from stand       Lower Body Dressing: Maximal assistance;Bed level   Toilet Transfer: Supervision/safety;Ambulation   Toileting- Clothing Manipulation and Hygiene: Minimal assistance;Sit to/from stand         General ADL Comments: ambulated to bathroom and used urinal. Educated on sidestepping over tub.  Educated on back precautions and ADLs.  Pt/wife verbalize understanding. Also educated on toilet aide, if needed  Pt can perform UB adls with set up     Vision     Perception     Praxis      Pertinent Vitals/Pain Pain Assessment: 0-10 Pain Score: 2  Pain Location: back Pain Descriptors / Indicators: Sore Pain Intervention(s): Limited activity within patient's tolerance;Monitored during session;Premedicated before session;Repositioned     Hand  Dominance     Extremity/Trunk Assessment Upper Extremity Assessment Upper Extremity Assessment: Overall WFL for tasks assessed           Communication Communication Communication: No difficulties   Cognition Arousal/Alertness: Awake/alert Behavior During Therapy: WFL for tasks assessed/performed Overall Cognitive Status: Within Functional Limits for tasks assessed                     General Comments       Exercises       Shoulder Instructions      Home Living Family/patient expects to be discharged to:: Private residence Living Arrangements: Spouse/significant other Available Help at Discharge: Family;Available 24 hours/day               Bathroom Shower/Tub: Tub/shower unit Shower/tub characteristics: Architectural technologist: Standard     Home Equipment: Bedside commode   Additional Comments: wife has had 4 hip sxs      Prior Functioning/Environment Level of Independence: Independent             OT Diagnosis: Acute pain   OT Problem List:     OT Treatment/Interventions:      OT Goals(Current goals can be found in the care plan section) Acute Rehab OT Goals Patient Stated Goal: home OT Goal Formulation: All assessment and education complete, DC  therapy  OT Frequency:     Barriers to D/C:            Co-evaluation              End of Session    Activity Tolerance: Patient tolerated treatment well Patient left:  (with PT)   Time: 6047-9987 OT Time Calculation (min): 10 min Charges:  OT General Charges $OT Visit: 1 Procedure OT Evaluation $OT Eval Low Complexity: 1 Procedure G-Codes: OT G-codes **NOT FOR INPATIENT CLASS** Functional Assessment Tool Used: clinical observation and judgment Functional Limitation: Self care Self Care Current Status (A1587): At least 60 percent but less than 80 percent impaired, limited or restricted Self Care Goal Status (G7618): At least 60 percent but less than 80 percent impaired, limited  or restricted Self Care Discharge Status [phone]): At least 60 percent but less than 80 percent impaired, limited or restricted  Rozella Servello 02/15/2016, 8:17 AM  Lesle Chris, OTR/L [phone] 02/15/2016

## 2016-02-15 NOTE — Progress Notes (Signed)
Subjective: 1 Day Post-Op Procedure(s) (LRB): MICRO  LUMBER L4-5 (N/A) Patient reports pain as mild.  No leg pain. Incisional pain well controlled. No other c/o. Ready to go home today. Seen in AM rounds by Dr. Tonita Cong.  Objective: Vital signs in last 24 hours: Temp:  [97.4 F (36.3 C)-99 F (37.2 C)] 97.7 F (36.5 C) (08/03 0528) Pulse Rate:  [66-95] 80 (08/03 0934) Resp:  [12-20] 15 (08/03 0934) BP: (131-157)/(71-96) 142/94 (08/03 0934) SpO2:  [95 %-100 %] 97 % (08/03 0934)  Intake/Output from previous day: 08/02 0701 - 08/03 0700 In: 2700 [P.O.:1495; I.V.:1150; IV Piggyback:55] Out: 2300 [Urine:2250; Blood:50] Intake/Output this shift: Total I/O In: 120 [P.O.:120] Out: -   No results for input(s): HGB in the last 72 hours. No results for input(s): WBC, RBC, HCT, PLT in the last 72 hours. No results for input(s): NA, K, CL, CO2, BUN, CREATININE, GLUCOSE, CALCIUM in the last 72 hours. No results for input(s): LABPT, INR in the last 72 hours.  Neurologically intact ABD soft Neurovascular intact Sensation intact distally Intact pulses distally Dorsiflexion/Plantar flexion intact Incision: dressing C/D/I and no drainage No cellulitis present Compartment soft no sign of DVT  Assessment/Plan: 1 Day Post-Op Procedure(s) (LRB): MICRO  LUMBER L4-5 (N/A) Advance diet Up with therapy D/C IV fluids  Discussed d/C instructions, Lspine precautions D/C home today Follow up in 10-14 days for suture removal  BISSELL, JACLYN M. 02/15/2016, 9:46 AM

## 2016-02-15 NOTE — Care Management Note (Signed)
Case Management Note  Patient Details  Name: [name] MRN: [id number] Date of Birth: 11-05-1985  Subjective/Objective:                  MICRO  LUMBER L4-5 (N/A) Action/Plan: Discharge planning Expected Discharge Date:  02/15/16               Expected Discharge Plan:  Home/Self Care  In-House Referral:     Discharge planning Services  CM Consult  Post Acute Care Choice:  NA Choice offered to:  Patient  DME Arranged:  N/A DME Agency:  NA  HH Arranged:  NA HH Agency:     Status of Service:  Completed, signed off  If discussed at [place] of Stay Meetings, dates discussed:    Additional Comments: CM met with pt in room to discuss discharge needs.  Pt denies any need for [facility] or DME.  NO HH services recc or ordered and no DME recc or ordered.  NO other CM needs were communicated. Dellie Catholic, RN 02/15/2016, 10:15 AM

## 2016-02-15 NOTE — Evaluation (Signed)
Physical Therapy Evaluation Patient Details Name: [name] MRN: [id number] DOB: 04-28-1986 Today's Date: 02/15/2016   History of Present Illness  s/p L4-5 decompression  Clinical Impression  Pt s/p back surgery and presents with functional mobility limitations 2* post op pain and back precautions.  Pt is mobilizing at supervision level with good awareness of back precautions and is eager for return home with spouse.    Follow Up Recommendations No PT follow up    Equipment Recommendations  None recommended by PT    Recommendations for Other Services       Precautions / Restrictions Precautions Precautions: Fall;Back Restrictions Weight Bearing Restrictions: No      Mobility  Bed Mobility Overal bed mobility: Modified Independent                Transfers Overall transfer level: Needs assistance Equipment used: None Transfers: Sit to/from Stand Sit to Stand: Supervision         General transfer comment: cues to power up with legs and keep back straight  Ambulation/Gait Ambulation/Gait assistance: Min guard;Supervision Ambulation Distance (Feet): 600 Feet Assistive device: None Gait Pattern/deviations: Step-through pattern;WFL(Within Functional Limits)     General Gait Details: cues for pace  Stairs Stairs: Yes Stairs assistance: Min guard Stair Management: One rail Right;Forwards;Step to pattern Number of Stairs: 4    Wheelchair Mobility    Modified Rankin (Stroke Patients Only)       Balance                                             Pertinent Vitals/Pain Pain Assessment: 0-10 Pain Score: 2  Pain Location: back Pain Descriptors / Indicators: Aching;Sore Pain Intervention(s): Limited activity within patient's tolerance;Monitored during session    [place] expects to be discharged to:: Private residence Living Arrangements: Spouse/significant other Available Help at Discharge:  Family;Available 24 hours/day Type of Home: House Home Access: Stairs to enter     Home Layout: Two level Home Equipment: Bedside commode Additional Comments: wife has had 4 hip sxs    Prior Function Level of Independence: Independent               Hand Dominance        Extremity/Trunk Assessment   Upper Extremity Assessment: Overall WFL for tasks assessed           Lower Extremity Assessment: Overall WFL for tasks assessed      Cervical / Trunk Assessment: Normal  Communication   Communication: No difficulties  Cognition Arousal/Alertness: Awake/alert Behavior During Therapy: WFL for tasks assessed/performed Overall Cognitive Status: Within Functional Limits for tasks assessed                      General Comments      Exercises        Assessment/Plan    PT Assessment Patient needs continued PT services  PT Diagnosis Difficulty walking   PT Problem List Decreased strength;Pain;Decreased knowledge of precautions;Decreased knowledge of use of DME;Decreased mobility;Decreased activity tolerance  PT Treatment Interventions Gait training;Stair training;Functional mobility training;Therapeutic activities;Patient/family education   PT Goals (Current goals can be found in the Care Plan section) Acute Rehab PT Goals Patient Stated Goal: home    Frequency Min 1X/week   Barriers to discharge        Co-evaluation  End of Session   Activity Tolerance: Patient tolerated treatment well Patient left: in chair;with call bell/phone within reach;with family/visitor present Nurse Communication: Mobility status    Functional Assessment Tool Used: Clinical judgement Functional Limitation: Mobility: Walking and moving around Mobility: Walking and Moving Around Current Status (A4847): At least 1 percent but less than 20 percent impaired, limited or restricted Mobility: Walking and Moving Around Goal Status [phone]): At least 1 percent  but less than 20 percent impaired, limited or restricted Mobility: Walking and Moving Around Discharge Status [phone]): At least 1 percent but less than 20 percent impaired, limited or restricted    Time: 0810-0821 PT Time Calculation (min) (ACUTE ONLY): 11 min   Charges:   PT Evaluation $PT Eval Low Complexity: 1 Procedure     PT G Codes:   PT G-Codes **NOT FOR INPATIENT CLASS** Functional Assessment Tool Used: Clinical judgement Functional Limitation: Mobility: Walking and moving around Mobility: Walking and Moving Around Current Status (D7445): At least 1 percent but less than 20 percent impaired, limited or restricted Mobility: Walking and Moving Around Goal Status [phone]): At least 1 percent but less than 20 percent impaired, limited or restricted Mobility: Walking and Moving Around Discharge Status [phone]): At least 1 percent but less than 20 percent impaired, limited or restricted    Jamariah Tony 02/15/2016, 12:11 PM

## 2016-02-15 NOTE — Discharge Summary (Signed)
Physician Discharge Summary   Patient ID: [name] MRN: [id number] DOB/AGE: 03/13/1986 30 y.o.  Admit date: 02/14/2016 Discharge date: 02/15/2016  Primary Diagnosis:   STENOSIS HNP L4-5  Admission Diagnoses:  Past Medical History:  Diagnosis Date  . Anxiety    panic disorder  . Hypertension    Discharge Diagnoses:   Principal Problem:   HNP (herniated nucleus pulposus), lumbar Active Problems:   Spinal stenosis of lumbar region  Procedure:  Procedure(s) (LRB): MICRO  LUMBER L4-5 (N/A)   Consults: None  HPI:  see H&P    Laboratory Data: Hospital Outpatient Visit on 02/09/2016  Component Date Value Ref Range Status  . ABO/RH(D) 02/09/2016 A POS   Final   No results for input(s): HGB in the last 72 hours. No results for input(s): WBC, RBC, HCT, PLT in the last 72 hours. No results for input(s): NA, K, CL, CO2, BUN, CREATININE, GLUCOSE, CALCIUM in the last 72 hours. No results for input(s): LABPT, INR in the last 72 hours.  X-Rays:Dg Lumbar Spine 2-3 Views  Addendum Date: 02/12/2016   ADDENDUM REPORT: 02/12/2016 09:00 ADDENDUM: There are assumed to be 5 lumbar type non rib-bearing vertebral bodies. 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 There is no chest x-ray available in PACs for rib numbering. This addendum was created by Dr. David Martinique, [facility]. Electronically Signed   By: David  Martinique M.D.   On: 02/12/2016 09:00  Result Date: 02/12/2016 CLINICAL DATA:  Preop lumbar surgery.  No prior . EXAM: LUMBAR SPINE - 2-3 VIEW COMPARISON:  Scratched No prior. FINDINGS: No acute soft tissue or bony abnormality. Normal alignment and mineralization. No bowel distention . IMPRESSION: No acute or focal abnormality. Electronically Signed: By: Marcello Moores  Register On: 02/09/2016 11:36  Dg Spine Portable 1 View  Result Date: 02/14/2016 CLINICAL DATA:  L5-S1 laminectomy EXAM: PORTABLE SPINE - 1 VIEW COMPARISON:  Study obtained earlier in the day; prior lumbar images February 09, 2016 FINDINGS: Cross-table lateral lumbar image labeled #3 submitted. Previous images have documented a transitional S1 vertebra with a demonstrable S1-2 interspace. The current enumeration is performed commensurate with prior labeling. Using this tumor or a shin, the metallic probe tip overlies the posterior aspect of the L5-S1 interspace. No fracture or spondylolisthesis. IMPRESSION: Metallic probe tip overlies the L5-S1 interspace. 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 Electronically Signed   By: Lowella Grip III M.D.   On: 02/14/2016 10:03   Dg Spine Portable 1 View  Result Date: 02/14/2016 CLINICAL DATA:  Surgical level L5-S1. EXAM: PORTABLE SPINE - 1 VIEW COMPARISON:  Multiple prior plain films. FINDINGS: Intraoperative lateral radiograph image 2, portable, demonstrates a probe from a posterior approach directed most closely toward the L5-S1 interspace. Spinous processes are labeled. IMPRESSION: L5-S1 is marked. Electronically Signed   By: Staci Righter M.D.   On: 02/14/2016 09:15   Dg Spine Portable 1 View  Result Date: 02/14/2016 CLINICAL DATA:  Surgical level L5-S1. EXAM: PORTABLE SPINE - 1 VIEW COMPARISON:  02/09/2016 FINDINGS: Transitional anatomy again noted at the lumbosacral junction. Numbering was done to P consistent with previous numbering on most recent plain films 02/09/2016. Posterior needles are noted, directed at the L4-5 and L5-S1 interspaces. IMPRESSION: Transitional S1 segment.  Intraoperative localization as above. Electronically Signed   By: Rolm Baptise M.D.   On: 02/14/2016 09:03    EKG: Orders placed or performed during the hospital encounter of 02/09/16  . EKG 12 lead  .  EKG 12 lead     Hospital Course: Patient was admitted to [facility] and taken to the OR and underwent the above state procedure without complications.  Patient tolerated the procedure well and was later transferred to the recovery room and then to  the orthopaedic floor for postoperative care.  They were given PO and IV analgesics for pain control following their surgery.  They were given 24 hours of postoperative antibiotics.   PT was consulted postop to assist with mobility and transfers.  The patient was allowed to be WBAT with therapy and was taught back precautions. Discharge planning was consulted to help with postop disposition and equipment needs.  Patient had a good night on the evening of surgery and started to get up OOB with therapy on day one. Patient was seen in rounds and was ready to go home on day one.  They were given discharge instructions and dressing directions.  They were instructed on when to follow up in the office with Dr. Tonita Cong.   Diet: Regular diet Activity:WBAT; Lspine precautions Follow-up:in 10-14 days Disposition - Home Discharged Condition: good   Discharge Instructions    Call MD / Call 911    Complete by:  As directed   If you experience chest pain or shortness of breath, CALL 911 and be transported to the hospital emergency room.  If you develope a fever above 101 F, pus (white drainage) or increased drainage or redness at the wound, or calf pain, call your surgeon's office.   Constipation Prevention    Complete by:  As directed   Drink plenty of fluids.  Prune juice may be helpful.  You may use a stool softener, such as Colace (over the counter) 100 mg twice a day.  Use MiraLax (over the counter) for constipation as needed.   Diet - low sodium heart healthy    Complete by:  As directed   Increase activity slowly as tolerated    Complete by:  As directed       Medication List    TAKE these medications   docusate sodium 100 MG capsule Commonly known as:  COLACE Take 1 capsule (100 mg total) by mouth 2 (two) times daily as needed for mild constipation.   escitalopram 10 MG tablet Commonly known as:  LEXAPRO Take 1 tablet by mouth daily.   lisinopril 20 MG tablet Commonly known as:   PRINIVIL,ZESTRIL Take 1 tablet by mouth daily.   methocarbamol 500 MG tablet Commonly known as:  ROBAXIN Take 1 tablet (500 mg total) by mouth every 6 (six) hours as needed for muscle spasms. What changed:  when to take this  reasons to take this   oxyCODONE-acetaminophen 7.5-325 MG tablet Commonly known as:  PERCOCET Take 1-2 tablets by mouth every 4 (four) hours as needed for severe pain.   polyethylene glycol packet Commonly known as:  MIRALAX / GLYCOLAX Take 17 g by mouth daily.      Follow-up Information    BEANE,JEFFREY C, MD Follow up in 2 week(s).   Specialty:  Orthopedic Surgery Contact information: [address] [place] [address] [phone]           Signed: Lacie Draft, PA-C Orthopaedic Surgery 02/15/2016, 9:48 AM

## 2016-05-22 ENCOUNTER — Encounter (HOSPITAL_COMMUNITY): Payer: Self-pay

## 2016-05-22 ENCOUNTER — Inpatient Hospital Stay (HOSPITAL_COMMUNITY)
Admission: EM | Admit: 2016-05-22 | Discharge: 2016-05-24 | DRG: 386 | Disposition: A | Discharge status: Home / Self Care | Payer: 59 | Attending: Internal Medicine | Admitting: Internal Medicine

## 2016-05-22 ENCOUNTER — Emergency Department (HOSPITAL_COMMUNITY): Payer: 59

## 2016-05-22 DIAGNOSIS — E860 Dehydration: Secondary | ICD-10-CM | POA: Diagnosis present

## 2016-05-22 DIAGNOSIS — F410 Panic disorder [episodic paroxysmal anxiety] without agoraphobia: Secondary | ICD-10-CM | POA: Diagnosis present

## 2016-05-22 DIAGNOSIS — Z880 Allergy status to penicillin: Secondary | ICD-10-CM | POA: Diagnosis not present

## 2016-05-22 DIAGNOSIS — Z8249 Family history of ischemic heart disease and other diseases of the circulatory system: Secondary | ICD-10-CM

## 2016-05-22 DIAGNOSIS — D62 Acute posthemorrhagic anemia: Secondary | ICD-10-CM | POA: Diagnosis present

## 2016-05-22 DIAGNOSIS — I10 Essential (primary) hypertension: Secondary | ICD-10-CM | POA: Diagnosis present

## 2016-05-22 DIAGNOSIS — Z833 Family history of diabetes mellitus: Secondary | ICD-10-CM

## 2016-05-22 DIAGNOSIS — R197 Diarrhea, unspecified: Secondary | ICD-10-CM | POA: Diagnosis not present

## 2016-05-22 DIAGNOSIS — Z8379 Family history of other diseases of the digestive system: Secondary | ICD-10-CM | POA: Diagnosis not present

## 2016-05-22 DIAGNOSIS — K5100 Ulcerative (chronic) pancolitis without complications: Secondary | ICD-10-CM

## 2016-05-22 DIAGNOSIS — K921 Melena: Secondary | ICD-10-CM | POA: Diagnosis not present

## 2016-05-22 DIAGNOSIS — K51011 Ulcerative (chronic) pancolitis with rectal bleeding: Secondary | ICD-10-CM | POA: Diagnosis present

## 2016-05-22 DIAGNOSIS — R112 Nausea with vomiting, unspecified: Secondary | ICD-10-CM | POA: Diagnosis not present

## 2016-05-22 DIAGNOSIS — R7989 Other specified abnormal findings of blood chemistry: Secondary | ICD-10-CM | POA: Diagnosis present

## 2016-05-22 DIAGNOSIS — Z79899 Other long term (current) drug therapy: Secondary | ICD-10-CM

## 2016-05-22 DIAGNOSIS — K922 Gastrointestinal hemorrhage, unspecified: Secondary | ICD-10-CM | POA: Diagnosis not present

## 2016-05-22 DIAGNOSIS — R634 Abnormal weight loss: Secondary | ICD-10-CM | POA: Diagnosis present

## 2016-05-22 DIAGNOSIS — K625 Hemorrhage of anus and rectum: Secondary | ICD-10-CM | POA: Diagnosis not present

## 2016-05-22 DIAGNOSIS — Z882 Allergy status to sulfonamides status: Secondary | ICD-10-CM

## 2016-05-22 LAB — CBC
HCT: 33 % — ABNORMAL LOW (ref 39.0–52.0)
HEMATOCRIT: 32.1 % — AB (ref 39.0–52.0)
HEMOGLOBIN: 11.4 g/dL — AB (ref 13.0–17.0)
HEMOGLOBIN: 10.8 g/dL — AB (ref 13.0–17.0)
MCH: 28.4 pg (ref 26.0–34.0)
MCH: 28.5 pg (ref 26.0–34.0)
MCHC: 33.6 g/dL (ref 30.0–36.0)
MCHC: 34.5 g/dL (ref 30.0–36.0)
MCV: 82.5 fL (ref 78.0–100.0)
MCV: 84.5 fL (ref 78.0–100.0)
Platelets: 555 10*3/uL — ABNORMAL HIGH (ref 150–400)
Platelets: 572 10*3/uL — ABNORMAL HIGH (ref 150–400)
RBC: 4 MIL/uL — AB (ref 4.22–5.81)
RBC: 3.8 MIL/uL — AB (ref 4.22–5.81)
RDW: 12.6 % (ref 11.5–15.5)
RDW: 12.8 % (ref 11.5–15.5)
WBC: 10.5 10*3/uL (ref 4.0–10.5)
WBC: 10.4 10*3/uL (ref 4.0–10.5)

## 2016-05-22 LAB — URINALYSIS, ROUTINE W REFLEX MICROSCOPIC
GLUCOSE, UA: NEGATIVE mg/dL
HGB URINE DIPSTICK: NEGATIVE
Ketones, ur: 15 mg/dL — AB
Leukocytes, UA: NEGATIVE
Nitrite: NEGATIVE
PROTEIN: NEGATIVE mg/dL
Specific Gravity, Urine: 1.026 (ref 1.005–1.030)
pH: 6 (ref 5.0–8.0)

## 2016-05-22 LAB — COMPREHENSIVE METABOLIC PANEL
ALT: 36 U/L (ref 17–63)
AST: 23 U/L (ref 15–41)
Albumin: 3.7 g/dL (ref 3.5–5.0)
Alkaline Phosphatase: 52 U/L (ref 38–126)
Anion gap: 7 (ref 5–15)
BUN: 10 mg/dL (ref 6–20)
CHLORIDE: 102 mmol/L (ref 101–111)
CO2: 24 mmol/L (ref 22–32)
CREATININE: 1.06 mg/dL (ref 0.61–1.24)
Calcium: 8.8 mg/dL — ABNORMAL LOW (ref 8.9–10.3)
Glucose, Bld: 106 mg/dL — ABNORMAL HIGH (ref 65–99)
POTASSIUM: 4.2 mmol/L (ref 3.5–5.1)
SODIUM: 133 mmol/L — AB (ref 135–145)
Total Bilirubin: 0.6 mg/dL (ref 0.3–1.2)
Total Protein: 7 g/dL (ref 6.5–8.1)

## 2016-05-22 LAB — TYPE AND SCREEN
ABO/RH(D): A POS
ANTIBODY SCREEN: NEGATIVE

## 2016-05-22 LAB — LIPASE, BLOOD: LIPASE: 26 U/L (ref 11–51)

## 2016-05-22 LAB — POC OCCULT BLOOD, ED: FECAL OCCULT BLD: POSITIVE — AB

## 2016-05-22 MED ORDER — SODIUM CHLORIDE 0.9 % IV BOLUS (SEPSIS)
2000.0000 mL | Freq: Once | INTRAVENOUS | Status: AC
  Administered 2016-05-22: 2000 mL via INTRAVENOUS

## 2016-05-22 MED ORDER — ACETAMINOPHEN 325 MG PO TABS
650.0000 mg | ORAL_TABLET | Freq: Four times a day (QID) | ORAL | Status: DC | PRN
Start: 2016-05-22 — End: 2016-05-24

## 2016-05-22 MED ORDER — HYDROCODONE-ACETAMINOPHEN 5-325 MG PO TABS
1.0000 | ORAL_TABLET | ORAL | Status: DC | PRN
  Administered 2016-05-23: 2 via ORAL
  Filled 2016-05-22: qty 2

## 2016-05-22 MED ORDER — ACETAMINOPHEN 650 MG RE SUPP: 650.0000 mg | Freq: Four times a day (QID) | RECTAL | Status: DC | PRN

## 2016-05-22 MED ORDER — SODIUM CHLORIDE 0.9 % IV SOLN
INTRAVENOUS | Status: DC
  Administered 2016-05-23: 01:00:00 via INTRAVENOUS

## 2016-05-22 MED ORDER — ONDANSETRON HCL 4 MG PO TABS: 4.0000 mg | ORAL_TABLET | Freq: Four times a day (QID) | ORAL | Status: DC | PRN

## 2016-05-22 MED ORDER — ESCITALOPRAM OXALATE 10 MG PO TABS: 10.0000 mg | ORAL_TABLET | Freq: Every day | ORAL | Status: DC

## 2016-05-22 MED ORDER — SODIUM CHLORIDE 0.9% FLUSH
3.0000 mL | Freq: Two times a day (BID) | INTRAVENOUS | Status: DC
  Administered 2016-05-22: 3 mL via INTRAVENOUS

## 2016-05-22 MED ORDER — ONDANSETRON HCL 4 MG/2ML IJ SOLN
4.0000 mg | Freq: Four times a day (QID) | INTRAMUSCULAR | Status: DC | PRN
  Administered 2016-05-23 (×2): 4 mg via INTRAVENOUS
  Filled 2016-05-22 (×2): qty 2

## 2016-05-22 NOTE — ED Notes (Signed)
Contact is wife [place] [phone]

## 2016-05-22 NOTE — ED Notes (Signed)
Patient provided with hospital bed.

## 2016-05-22 NOTE — ED Triage Notes (Signed)
Pt c/o intermittent abdominal pain, n/v/d, and bright red rectal bleeding after starting colonoscopy prep today.  Pain score 2/10.  Pt reports that he is unable to tolerate any intake.  Sts previous history of non-intractable vomiting and chronic diarrhea w/ intermittent rectal bleeding.  Pt called GI MD and was directed to ED.

## 2016-05-22 NOTE — ED Notes (Signed)
PT have been made aware of urine sample

## 2016-05-22 NOTE — H&P (Signed)
[name] FXT:[id number] DOB: 01-Dec-1985 DOA: 05/22/2016     PCP: Wonda Cerise, MD   Outpatient Specialists:GI in [facility] Patient coming from:   home Lives  With family    Chief Complaint: Nausea vomiting and bloody diarrhea  HPI: [name] is a 30 y.o. male with medical history significant of HTN and anxiety    Presented with worsening nausea and diarrhea. He's been having symptoms since early October feels that this was associated after she started on Effexor. He's been having 10-15 liquids stools per day and some arm started to be bloody. Bright red blood. Never had bloody bowel movements prior to this. She's been having sharp cramping pain in his abdomen. He feels that his bleeding is better ever since he stopped Effexor and Cymbalta. But his diarrhea and nausea has persisted. He is still having 10-15 stools per day sometimes it wakes him up in the middle the night. Eating makes diarrhea worse. Patient has been evaluated by this by GI and recommended colonoscopy. C. difficile was negative on 25th of October. Patient was started on GI prep and was ready for have colonoscopy tomorrow morning but continued to have severe nausea and vomiting and unable to keep anything by mouth and presented to emergency department he denies any history of GERD. Patient does have anxiety and panic disorder treated with SSRIs. Emesis has been nonbloody. Abdominal pain described as crampy he feels mildly lightheaded no chest pain or shortness of breath no fevers or chills associated this. Patient was not able to tolerate by mouth well in the emergency department . NO fever no chills no travel, no prior hx of IBD, no sick contacts. Patient has been losing weight and reports losing 30 pounds in the past 6 weeks Regarding pertinent Chronic problems: HTN treated with lisinopril    IN ER:  Temp (24hrs), Avg:97.7 F (36.5 C), Min:97.7 F (36.5 C), Max:97.7 F (36.5 C)   RR 15 HR 89 BP  128/81   NA 133  ALB 3.7 CR 1.06 WBC 10.5 Hemoglobin 14.3 in March 2017 today 11.4 Hemoccult-positive Plain films abdomen and chest unremarkable Following Medications were ordered in ER: Medications  sodium chloride 0.9 % bolus 2,000 mL (2,000 mLs Intravenous New Bag/Given 05/22/16 1827)     ER provider discussed case with:  GI  Hospitalist was called for admission for persistent nausea vomiting and bloody diarrhea blood in stool,  Review of Systems:    Pertinent positives include:  abdominal pain, nausea, vomiting, diarrhea, change in bowel habits, fatigue, weight loss   Constitutional:  No weight loss, night sweats, Fevers, chills,  HEENT:  No headaches, Difficulty swallowing,Tooth/dental problems,Sore throat,  No sneezing, itching, ear ache, nasal congestion, post nasal drip,  Cardio-vascular:  No chest pain, Orthopnea, PND, anasarca, dizziness, palpitations.no Bilateral lower extremity swelling  GI:  No heartburn, indigestion,  loss of appetite, melena, hematemesis Resp:  no shortness of breath at rest. No dyspnea on exertion, No excess mucus, no productive cough, No non-productive cough, No coughing up of blood.No change in color of mucus.No wheezing. Skin:  no rash or lesions. No jaundice GU:  no dysuria, change in color of urine, no urgency or frequency. No straining to urinate.  No flank pain.  Musculoskeletal:  No joint pain or no joint swelling. No decreased range of motion. No back pain.  Psych:  No change in mood or affect. No depression or anxiety. No memory loss.  Neuro: no localizing neurological complaints, no  tingling, no weakness, no double vision, no gait abnormality, no slurred speech, no confusion  As per HPI otherwise 10 point review of systems negative.   Past Medical History: Past Medical History:  Diagnosis Date  . Anxiety    panic disorder  . Hypertension    Past Surgical History:  Procedure Laterality Date  . LUMBAR  LAMINECTOMY/DECOMPRESSION MICRODISCECTOMY N/A 02/14/2016   Procedure: MICRO  LUMBER L4-5;  Surgeon: Susa Day, MD;  Location: [facility] ORS;  Service: Orthopedics;  Laterality: N/A;  . NO PAST SURGERIES       Social History:  Ambulatory  Independently     reports that he has never smoked. He has never used smokeless tobacco. He reports that he drinks alcohol. He reports that he does not use drugs.  Allergies:   Allergies  Allergen Reactions  . Penicillins Hives    Has patient had a PCN reaction causing immediate rash, facial/tongue/throat swelling, SOB or lightheadedness with hypotension: no Has patient had a PCN reaction causing severe rash involving mucus membranes or skin necrosis: no Has patient had a PCN reaction that required hospitalization no Has patient had a PCN reaction occurring within the last 10 years: yes If all of the above answers are "NO", then may proceed with Cephalosporin use.  . Sulfa Antibiotics Other (See Comments)    unsure       Family History:   Family History  Problem Relation Age of Onset  . Hypertension Mother   . Hypertension Father   . Diabetes Father   . Inflammatory bowel disease Other   . Diabetes Other   . Cancer Neg Hx     Medications: Prior to Admission medications   Medication Sig Start Date End Date Taking? Authorizing Provider  escitalopram (LEXAPRO) 10 MG tablet Take 1 tablet by mouth daily. 01/29/16  Yes Historical Provider, MD  lisinopril (PRINIVIL,ZESTRIL) 20 MG tablet Take 1 tablet by mouth daily. 01/24/16  Yes Historical Provider, MD  magnesium citrate SOLN Take 1 Bottle by mouth once.   Yes Historical Provider, MD  metoprolol succinate (TOPROL-XL) 25 MG 24 hr tablet Take 25 mg by mouth daily. 04/24/16  Yes Historical Provider, MD  promethazine (PHENERGAN) 12.5 MG tablet Take 12.5 mg by mouth every 6 (six) hours as needed for nausea/vomiting.   Yes Historical Provider, MD  Pseudoephedrine-APAP-DM (DAYQUIL MULTI-SYMPTOM COLD/FLU  PO) Take 2 capsules by mouth daily as needed (cough).   Yes Historical Provider, MD  docusate sodium (COLACE) 100 MG capsule Take 1 capsule (100 mg total) by mouth 2 (two) times daily as needed for mild constipation. Patient not taking: Reported on 05/22/2016 02/14/16   Susa Day, MD  methocarbamol (ROBAXIN) 500 MG tablet Take 1 tablet (500 mg total) by mouth every 6 (six) hours as needed for muscle spasms. Patient not taking: Reported on 05/22/2016 02/14/16   Susa Day, MD  oxyCODONE-acetaminophen (PERCOCET) 7.5-325 MG tablet Take 1-2 tablets by mouth every 4 (four) hours as needed for severe pain. Patient not taking: Reported on 05/22/2016 02/14/16   Susa Day, MD  polyethylene glycol [facility] / Floria Raveling) packet Take 17 g by mouth daily. Patient not taking: Reported on 05/22/2016 02/14/16   Susa Day, MD    Physical Exam: Patient Vitals for the past 24 hrs:  BP Temp Temp src Pulse Resp SpO2 Height Weight  05/22/16 1900 128/81 - - 89 15 100 % - -  05/22/16 1830 124/72 - - 98 14 97 % - -  05/22/16 1718 144/92 - -  112 18 98 % - -  05/22/16 1651 - - - - - - 6' 3"  (1.905 m) 136.1 kg (300 lb)  05/22/16 1650 136/86 97.7 F (36.5 C) Oral (!) 136 18 100 % - -    1. General:  in No Acute distress 2. Psychological: Alert and   Oriented 3. Head/ENT:    Dry Mucous Membranes                          Head Non traumatic, neck supple                          Normal   Dentition 4. SKIN:   decreased Skin turgor,  Skin clean Dry and intact no rash 5. Heart: Regular rate and rhythm no Murmur, Rub or gallop 6. Lungs:  no wheezes or crackles   7. Abdomen: Soft,  non-tender, Non distended 8. Lower extremities: no clubbing, cyanosis, or edema 9. Neurologically Grossly intact, moving all 4 extremities equally   10. MSK: Normal range of motion Hemoccult positive  body mass index is 37.5 kg/m.  Labs on Admission:   Labs on Admission: I have personally reviewed following labs and imaging  studies  CBC:  Recent Labs Lab 05/22/16 1830  WBC 10.5  HGB 11.4*  HCT 33.0*  MCV 82.5  PLT 536*   Basic Metabolic Panel:  Recent Labs Lab 05/22/16 1830  NA 133*  K 4.2  CL 102  CO2 24  GLUCOSE 106*  BUN 10  CREATININE 1.06  CALCIUM 8.8*   GFR: Estimated Creatinine Clearance: 150.1 mL/min (by C-G formula based on SCr of 1.06 mg/dL). Liver Function Tests:  Recent Labs Lab 05/22/16 1830  AST 23  ALT 36  ALKPHOS 52  BILITOT 0.6  PROT 7.0  ALBUMIN 3.7    Recent Labs Lab 05/22/16 1830  LIPASE 26   No results for input(s): AMMONIA in the last 168 hours. Coagulation Profile: No results for input(s): INR, PROTIME in the last 168 hours. Cardiac Enzymes: No results for input(s): CKTOTAL, CKMB, CKMBINDEX, TROPONINI in the last 168 hours. BNP (last 3 results) No results for input(s): PROBNP in the last 8760 hours. HbA1C: No results for input(s): HGBA1C in the last 72 hours. CBG: No results for input(s): GLUCAP in the last 168 hours. Lipid Profile: No results for input(s): CHOL, HDL, LDLCALC, TRIG, CHOLHDL, LDLDIRECT in the last 72 hours. Thyroid Function Tests: No results for input(s): TSH, T4TOTAL, FREET4, T3FREE, THYROIDAB in the last 72 hours. Anemia Panel: No results for input(s): VITAMINB12, FOLATE, FERRITIN, TIBC, IRON, RETICCTPCT in the last 72 hours. Urine analysis:    Component Value Date/Time   COLORURINE YELLOW 05/22/2016 1909   APPEARANCEUR CLEAR 05/22/2016 1909   LABSPEC 1.026 05/22/2016 1909   PHURINE 6.0 05/22/2016 1909   GLUCOSEU NEGATIVE 05/22/2016 1909   HGBUR NEGATIVE 05/22/2016 1909   BILIRUBINUR SMALL (A) 05/22/2016 1909   KETONESUR 15 (A) 05/22/2016 1909   PROTEINUR NEGATIVE 05/22/2016 1909   NITRITE NEGATIVE 05/22/2016 1909   LEUKOCYTESUR NEGATIVE 05/22/2016 1909   Sepsis Labs: @LABRCNTIP (procalcitonin:4,lacticidven:4) )No results found for this or any previous visit (from the past 240 hour(s)).      UA  no evidence of  UTI   No results found for: HGBA1C  Estimated Creatinine Clearance: 150.1 mL/min (by C-G formula based on SCr of 1.06 mg/dL).  BNP (last 3 results) No results for input(s): PROBNP in the last  8760 hours.   ECG REPORT Not obtained  Filed Weights   05/22/16 1651  Weight: 136.1 kg (300 lb)     Cultures: No results found for: SDES, SPECREQUEST, CULT, REPTSTATUS   Radiological Exams on Admission: Dg Abd Acute W/chest  Result Date: 05/22/2016 CLINICAL DATA:  Nausea vomiting EXAM: DG ABDOMEN ACUTE W/ 1V CHEST COMPARISON:  None. FINDINGS: Single-view chest demonstrates no acute infiltrate or effusion. Normal cardiomediastinal silhouette. No pneumothorax. Supine and upright views of the abdomen demonstrate no free air. There is a nonobstructed gas pattern with few gas-filled nondilated loops of small bowel in the central and left abdomen. There is scattered colonic gas. No pathologic calcifications. IMPRESSION: 1. Single-view chest is within normal limits. 2. Nonobstructed gas pattern Electronically Signed   By: Donavan Foil M.D.   On: 05/22/2016 18:19    Chart has been reviewed    Assessment/Plan  30 y.o. male with medical history significant of HTN and anxiety being admitted for intractable nausea vomiting and bloody diarrhea associated dehydration  Present on Admission: . Nausea vomiting and diarrhea ongoing for the past 1 month. Appreciate GI consult. Suspect inflammatory bowel disease origin possibly ulcerative colitis will need colonoscopy to evaluate further. GI will see in a.m. and probably will scope at that time. Monitor on telemetry given lightheadedness. Update serial CBC. Rehydrate . Blood in stool, frank S per GI consult will need colonoscopy in a.m. monitor for significant blood loss . Dehydration rehydrated IV fluids and monitor     Other plan as per orders.  DVT prophylaxis:  SCD   Code Status:  FULL CODE      Family Communication:   Family at  Bedside   Wife    Disposition Plan:    To home once workup is complete and patient is stable                    Consults called: GI [facility]  Admission status:   inpatient      Level of care   tele         I have spent a total of 56 min on this admission   [name] 05/22/2016, 9:45 PM    [facility]  Pager [phone]   after 2 AM please page floor coverage PA If 7AM-7PM, please contact the day team taking care of the patient  [email or web address]  Password [email or web address]

## 2016-05-22 NOTE — ED Notes (Addendum)
Patient reports large amounts of blood in stool, last trip to restroom. Also states the PO fluid challenge caused him to feel nauseated, with stomach cramping and dry heaves.

## 2016-05-22 NOTE — ED Provider Notes (Signed)
[facility] DEPT Provider Note   CSN: [id number] Arrival date & time: 05/22/16  4174     History   Chief Complaint Chief Complaint  Patient presents with  . Abdominal Pain  . Emesis  . Rectal Bleeding    HPI [name] is a 30 y.o. male.HPI   patient has had crampy abdominal pain, diffuse accompanied by diarrhea and vomiting for several months. He was drinking magnesium citrate today in preparation for colonoscopy tomorrow, when he developed several successive episodes of nonbloody emesis, and crampy abdominal pain. He also had 6 episodes of diarrhea mixed with blood. Presently he is pain-free. Feels mildly lightheaded. No other associated symptoms. No treatment prior to coming here. He is not presently nauseated and has no abdominal pain. He called his gastroenterologist's office who advised him to come here for evaluation. He feels better without treatment  Past Medical History:  Diagnosis Date  . Anxiety    panic disorder  . Hypertension     Patient Active Problem List   Diagnosis Date Noted  . HNP (herniated nucleus pulposus), lumbar 02/14/2016  . Spinal stenosis of lumbar region 02/14/2016    Past Surgical History:  Procedure Laterality Date  . LUMBAR LAMINECTOMY/DECOMPRESSION MICRODISCECTOMY N/A 02/14/2016   Procedure: MICRO  LUMBER L4-5;  Surgeon: Susa Day, MD;  Location: [facility];  Service: Orthopedics;  Laterality: N/A;  . NO PAST SURGERIES         Home Medications    Prior to Admission medications   Medication Sig Start Date End Date Taking? Authorizing Provider  docusate sodium (COLACE) 100 MG capsule Take 1 capsule (100 mg total) by mouth 2 (two) times daily as needed for mild constipation. 02/14/16   Susa Day, MD  escitalopram (LEXAPRO) 10 MG tablet Take 1 tablet by mouth daily. 01/29/16   Historical Provider, MD  lisinopril (PRINIVIL,ZESTRIL) 20 MG tablet Take 1 tablet by mouth daily. 01/24/16   Historical Provider, MD  methocarbamol  (ROBAXIN) 500 MG tablet Take 1 tablet (500 mg total) by mouth every 6 (six) hours as needed for muscle spasms. 02/14/16   Susa Day, MD  oxyCODONE-acetaminophen (PERCOCET) 7.5-325 MG tablet Take 1-2 tablets by mouth every 4 (four) hours as needed for severe pain. 02/14/16   Susa Day, MD  polyethylene glycol [facility] / GLYCOLAX) packet Take 17 g by mouth daily. 02/14/16   Susa Day, MD    Family History History reviewed. No pertinent family history.  Social History Social History  Substance Use Topics  . Smoking status: Never Smoker  . Smokeless tobacco: Never Used  . Alcohol use Yes     Comment: occ     Allergies   Penicillins and Sulfa antibiotics   Review of Systems Review of Systems  Constitutional: Negative.   HENT: Negative.   Respiratory: Negative.   Cardiovascular: Positive for chest pain.       Syncope  Gastrointestinal: Positive for abdominal pain, blood in stool, diarrhea, nausea and vomiting.  Musculoskeletal: Negative.   Skin: Negative.   Allergic/Immunologic: Negative.   Neurological: Positive for light-headedness.  Psychiatric/Behavioral: Negative.   All other systems reviewed and are negative.    Physical Exam Updated Vital Signs BP 144/92   Pulse 112   Temp 97.7 F (36.5 C) (Oral)   Resp 18   Ht 6' 3"  (1.905 m)   Wt 300 lb (136.1 kg)   SpO2 98%   BMI 37.50 kg/m   Physical Exam  Constitutional: He appears well-developed and well-nourished. No distress.  HENT:  Head: Normocephalic and atraumatic.  Mucous membranes dry  Eyes: Conjunctivae are normal. Pupils are equal, round, and reactive to light.  Neck: Neck supple. No tracheal deviation present. No thyromegaly present.  Cardiovascular: Regular rhythm.   No murmur heard. Mildly tachycardic. Pulse counted by me at 10 8 bpm  Pulmonary/Chest: Effort normal and breath sounds normal.  Abdominal: Soft. Bowel sounds are normal. He exhibits no distension. There is no tenderness.  Obese    Genitourinary: Rectum normal and penis normal.  Genitourinary Comments: Rectal normal tone nontender Brown stool, trace Hemoccult positive  Musculoskeletal: Normal range of motion. He exhibits no edema or tenderness.  Neurological: He is alert. Coordination normal.  Skin: Skin is warm and dry. No rash noted.  Psychiatric: He has a normal mood and affect.  Nursing note and vitals reviewed.    ED Treatments / Results  Labs (all labs ordered are listed, but only abnormal results are displayed) Labs Reviewed  POC OCCULT BLOOD, ED - Abnormal; Notable for the following:       Result Value   Fecal Occult Bld POSITIVE (*)    All other components within normal limits  COMPREHENSIVE METABOLIC PANEL  CBC  LIPASE, BLOOD  URINALYSIS, ROUTINE W REFLEX MICROSCOPIC (NOT AT [facility])  POC OCCULT BLOOD, ED  TYPE AND SCREEN    EKG  EKG Interpretation None       Radiology No results found.  Procedures Procedures (including critical care time)  Medications Ordered in ED Medications  sodium chloride 0.9 % bolus 2,000 mL (not administered)     Initial Impression / Assessment and Plan / ED Course  I have reviewed the triage vital signs and the nursing notes. 8:45 PM patient reports that he had dry heaves after attempting to drink water. He also reports that he had a large bloody bowel movement while here. I consulted Dr. Roel Cluck from hospital service who will arrange for overnight stay. I also consulted Dr Havery Moros gastroenterology service who will see patient in the hospital tomorrow. Pertinent labs & imaging results that were available during my care of the patient were reviewed by me and considered in my medical decision making (see chart for details). 9:30 PM patient resting comfortably and appears in no distress Clinical Course     X-rays viewed by me Results for orders placed or performed during the hospital encounter of 05/22/16  Comprehensive metabolic panel  Result Value Ref  Range   Sodium 133 (L) 135 - 145 mmol/L   Potassium 4.2 3.5 - 5.1 mmol/L   Chloride 102 101 - 111 mmol/L   CO2 24 22 - 32 mmol/L   Glucose, Bld 106 (H) 65 - 99 mg/dL   BUN 10 6 - 20 mg/dL   Creatinine, Ser 1.06 0.61 - 1.24 mg/dL   Calcium 8.8 (L) 8.9 - 10.3 mg/dL   Total Protein 7.0 6.5 - 8.1 g/dL   Albumin 3.7 3.5 - 5.0 g/dL   AST 23 15 - 41 U/L   ALT 36 17 - 63 U/L   Alkaline Phosphatase 52 38 - 126 U/L   Total Bilirubin 0.6 0.3 - 1.2 mg/dL   GFR calc non Af Amer >60 >60 mL/min   GFR calc Af Amer >60 >60 mL/min   Anion gap 7 5 - 15  CBC  Result Value Ref Range   WBC 10.5 4.0 - 10.5 K/uL   RBC 4.00 (L) 4.22 - 5.81 MIL/uL   Hemoglobin 11.4 (L) 13.0 - 17.0 g/dL  HCT 33.0 (L) 39.0 - 52.0 %   MCV 82.5 78.0 - 100.0 fL   MCH 28.5 26.0 - 34.0 pg   MCHC 34.5 30.0 - 36.0 g/dL   RDW 12.6 11.5 - 15.5 %   Platelets 572 (H) 150 - 400 K/uL  Lipase, blood  Result Value Ref Range   Lipase 26 11 - 51 U/L  Urinalysis, Routine w reflex microscopic  Result Value Ref Range   Color, Urine YELLOW YELLOW   APPearance CLEAR CLEAR   Specific Gravity, Urine 1.026 1.005 - 1.030   pH 6.0 5.0 - 8.0   Glucose, UA NEGATIVE NEGATIVE mg/dL   Hgb urine dipstick NEGATIVE NEGATIVE   Bilirubin Urine SMALL (A) NEGATIVE   Ketones, ur 15 (A) NEGATIVE mg/dL   Protein, ur NEGATIVE NEGATIVE mg/dL   Nitrite NEGATIVE NEGATIVE   Leukocytes, UA NEGATIVE NEGATIVE  POC occult blood, ED  Result Value Ref Range   Fecal Occult Bld POSITIVE (A) NEGATIVE  Type and screen [place]  Result Value Ref Range   ABO/RH(D) A POS    Antibody Screen NEG    Sample Expiration 05/25/2016    Dg Abd Acute W/chest  Result Date: 05/22/2016 CLINICAL DATA:  Nausea vomiting EXAM: DG ABDOMEN ACUTE W/ 1V CHEST COMPARISON:  None. FINDINGS: Single-view chest demonstrates no acute infiltrate or effusion. Normal cardiomediastinal silhouette. No pneumothorax. Supine and upright views of the abdomen demonstrate no free  air. There is a nonobstructed gas pattern with few gas-filled nondilated loops of small bowel in the central and left abdomen. There is scattered colonic gas. No pathologic calcifications. IMPRESSION: 1. Single-view chest is within normal limits. 2. Nonobstructed gas pattern Electronically Signed   By: Donavan Foil M.D.   On: 05/22/2016 18:19    Final Clinical Impressions(s) / ED Diagnoses  Diagnoses #1 acute on chronic GI bleeding Final diagnoses:  None   #2 nausea and vomiting #3 anemia New Prescriptions New Prescriptions   No medications on file     Orlie Dakin, MD 05/22/16 2142

## 2016-05-22 NOTE — ED Notes (Signed)
Bed: TB50 Expected date:  Expected time:  Means of arrival:  Comments: Triage 1

## 2016-05-22 NOTE — ED Notes (Addendum)
Patient had liquid stool 150 ml with moderate amount of bright red blood.

## 2016-05-22 NOTE — ED Notes (Signed)
Patient given water for PO challenge per provider.

## 2016-05-23 DIAGNOSIS — E860 Dehydration: Secondary | ICD-10-CM

## 2016-05-23 DIAGNOSIS — R197 Diarrhea, unspecified: Secondary | ICD-10-CM

## 2016-05-23 DIAGNOSIS — K921 Melena: Secondary | ICD-10-CM

## 2016-05-23 DIAGNOSIS — K625 Hemorrhage of anus and rectum: Secondary | ICD-10-CM

## 2016-05-23 DIAGNOSIS — R112 Nausea with vomiting, unspecified: Secondary | ICD-10-CM

## 2016-05-23 LAB — CBC
HCT: 29.7 % — ABNORMAL LOW (ref 39.0–52.0)
HEMATOCRIT: 32.9 % — AB (ref 39.0–52.0)
HEMOGLOBIN: 10 g/dL — AB (ref 13.0–17.0)
Hemoglobin: 11 g/dL — ABNORMAL LOW (ref 13.0–17.0)
MCH: 28.7 pg (ref 26.0–34.0)
MCH: 28.5 pg (ref 26.0–34.0)
MCHC: 33.7 g/dL (ref 30.0–36.0)
MCHC: 33.4 g/dL (ref 30.0–36.0)
MCV: 85.1 fL (ref 78.0–100.0)
MCV: 85.2 fL (ref 78.0–100.0)
PLATELETS: 567 10*3/uL — AB (ref 150–400)
Platelets: 528 10*3/uL — ABNORMAL HIGH (ref 150–400)
RBC: 3.49 MIL/uL — AB (ref 4.22–5.81)
RBC: 3.86 MIL/uL — ABNORMAL LOW (ref 4.22–5.81)
RDW: 12.9 % (ref 11.5–15.5)
RDW: 12.9 % (ref 11.5–15.5)
WBC: 8.6 10*3/uL (ref 4.0–10.5)
WBC: 9.7 10*3/uL (ref 4.0–10.5)

## 2016-05-23 LAB — COMPREHENSIVE METABOLIC PANEL
ALBUMIN: 3 g/dL — AB (ref 3.5–5.0)
ALK PHOS: 43 U/L (ref 38–126)
ALT: 27 U/L (ref 17–63)
ANION GAP: 7 (ref 5–15)
AST: 18 U/L (ref 15–41)
BUN: 8 mg/dL (ref 6–20)
CALCIUM: 8.2 mg/dL — AB (ref 8.9–10.3)
CHLORIDE: 105 mmol/L (ref 101–111)
CO2: 24 mmol/L (ref 22–32)
CREATININE: 0.94 mg/dL (ref 0.61–1.24)
GFR calc Af Amer: >60 mL/min (ref >60)
GFR calc non Af Amer: >60 mL/min (ref >60)
GLUCOSE: 107 mg/dL — AB (ref 65–99)
Potassium: 4.2 mmol/L (ref 3.5–5.1)
SODIUM: 136 mmol/L (ref 135–145)
Total Bilirubin: 0.8 mg/dL (ref 0.3–1.2)
Total Protein: 5.9 g/dL — ABNORMAL LOW (ref 6.5–8.1)

## 2016-05-23 LAB — PHOSPHORUS: Phosphorus: 3.5 mg/dL (ref 2.5–4.6)

## 2016-05-23 LAB — MRSA PCR SCREENING: MRSA by PCR: NEGATIVE

## 2016-05-23 LAB — TSH: TSH: 1.944 u[IU]/mL (ref 0.350–4.500)

## 2016-05-23 LAB — MAGNESIUM: Magnesium: 2.2 mg/dL (ref 1.7–2.4)

## 2016-05-23 MED ORDER — PEG-KCL-NACL-NASULF-NA ASC-C 100 G PO SOLR
0.5000 | Freq: Once | ORAL | Status: AC
  Administered 2016-05-23: 100 g via ORAL
  Filled 2016-05-23 (×2): qty 1

## 2016-05-23 MED ORDER — SODIUM CHLORIDE 0.9 % IV SOLN
INTRAVENOUS | Status: DC
  Administered 2016-05-24: 07:00:00 via INTRAVENOUS

## 2016-05-23 MED ORDER — PROMETHAZINE HCL 25 MG PO TABS: 25.0000 mg | ORAL_TABLET | Freq: Four times a day (QID) | ORAL | Status: DC | PRN

## 2016-05-23 MED ORDER — PEG-KCL-NACL-NASULF-NA ASC-C 100 G PO SOLR: 0.5000 | Freq: Once | ORAL | Status: DC

## 2016-05-23 MED ORDER — LISINOPRIL 20 MG PO TABS
20.0000 mg | ORAL_TABLET | Freq: Every day | ORAL | Status: DC
  Administered 2016-05-24: 20 mg via ORAL
  Filled 2016-05-23: qty 1

## 2016-05-23 MED ORDER — ESCITALOPRAM OXALATE 10 MG PO TABS
10.0000 mg | ORAL_TABLET | Freq: Every day | ORAL | Status: DC
  Administered 2016-05-23 – 2016-05-24 (×2): 10 mg via ORAL
  Filled 2016-05-23: qty 1

## 2016-05-23 MED ORDER — METOCLOPRAMIDE HCL 5 MG/ML IJ SOLN
10.0000 mg | Freq: Four times a day (QID) | INTRAMUSCULAR | Status: DC | PRN
  Administered 2016-05-24: 10 mg via INTRAVENOUS
  Filled 2016-05-23: qty 2

## 2016-05-23 MED ORDER — HYDRALAZINE HCL 20 MG/ML IJ SOLN
5.0000 mg | INTRAMUSCULAR | Status: DC | PRN
  Administered 2016-05-23: 5 mg via INTRAVENOUS
  Filled 2016-05-23: qty 1

## 2016-05-23 MED ORDER — PROMETHAZINE HCL 25 MG/ML IJ SOLN
12.5000 mg | Freq: Four times a day (QID) | INTRAMUSCULAR | Status: DC | PRN
  Administered 2016-05-23 (×2): 12.5 mg via INTRAVENOUS
  Filled 2016-05-23 (×2): qty 1

## 2016-05-23 MED ORDER — PROMETHAZINE HCL 25 MG RE SUPP
12.5000 mg | Freq: Four times a day (QID) | RECTAL | Status: DC | PRN
  Filled 2016-05-23: qty 1

## 2016-05-23 MED ORDER — LISINOPRIL 10 MG PO TABS: 20.0000 mg | ORAL_TABLET | Freq: Every day | ORAL | Status: DC

## 2016-05-23 MED ORDER — PEG-KCL-NACL-NASULF-NA ASC-C 100 G PO SOLR: 1.0000 | Freq: Once | ORAL | Status: DC

## 2016-05-23 NOTE — Care Management Note (Signed)
Case Management Note  Patient Details  Name: SHANNAN GARFINKEL MRN: [id number] Date of Birth: [birth year]  Subjective/Objective:         Upper gi bleed with tachycardia and melena           Action/Plan: home  Date:  May 23, 2016 Chart reviewed for concurrent status and case management needs. Will continue to follow the patient for status change: Discharge Planning: following for needs Expected discharge date: [id number] Velva Harman, BSN, [place], [place]  Expected Discharge Date:                  Expected Discharge Plan:  Home/Self Care  In-House Referral:     Discharge planning Services     Post Acute Care Choice:    Choice offered to:     DME Arranged:    DME Agency:     HH Arranged:    [place] Agency:     Status of Service:  In process, will continue to follow  If discussed at Long Length of Stay Meetings, dates discussed:    Additional Comments:  Leeroy Cha, RN 05/23/2016, 10:35 AM

## 2016-05-23 NOTE — Consult Note (Signed)
Consultation  Referring Provider:  Dr. Aileen Fass    Primary Care Physician:  Wonda Cerise, MD Primary Gastroenterologist:  Althia Forts       Reason for Consultation: Hematochezia, Diarrhea, Dyspepsia          HPI:   [name] is a 30 y.o. Caucasian male with a past medical history of anxiety and hypertension who presented to the ER on 05/22/16 with uncontrollable nausea and vomiting as well as abdominal pain and hematochezia.    Per chart review patient has been seeing [facility] in regards to his symptoms. He was scheduled for an EGD and colonoscopy this morning. He has previously had stool studies including a GI pathogen panel and C. difficile which were negative.    Today, the patient tells me that 8 weeks ago he was switched from Lexapro to Effexor by his general practitioner and started with diarrhea. He tells me that he'll have 3-4 liquid bowel movements after eating or drinking anything. He had this for about 3-4 weeks and was then changed to Cymbalta, he then started seeing occasional bright red blood in his stools and was changed back to Lexapro. GI pathogen panel was done by his general practitioner which was negative and he was then referred to GI. The GI office completed a C. difficile test which was negative and it was recommended that he have an EGD and colonoscopy for his symptoms. The patient started his prep yesterday around 2 PM and drank the first of bottle of magnesium citrate but about 30 minutes later he started vomiting, this continued and the patient could not control this. He called the clinic and was told to proceed to the ER.     The patient tells me that he has had diarrhea at least 3-4 times after he eats or drinks anything. Most recently he has had 5-6 bowel movements since admission and tells me that they are "reddish clear with clots", he denies seeing any brown stool. Associated symptoms include a weight loss of almost 30 pounds over the past 2  months. Patient also tells me that about 3 weeks ago he developed nausea which led to a decrease in appetite. He tells me that he also vomits occasionally, he has had at least 4-5 episodes over the past 3 weeks. His wife tells me this is typically when he "overeats because he is so hungry". The patient tells me that first it will start with belching, which then leads to nausea and gagging and eventually he will vomit. The patient was on Zantac prior to all of this 8 weeks ago, but then they decided that his panic disorders are causing his symptoms and stopped this medication. He denies heartburn or reflux. He also describes intermittent generalized abdominal cramping  Patient's family history is positive for Crohn's in his maternal grandmother.  Patient denies fever, chills, use of NSAIDs or previous episodes of the same prior to 8 weeks ago.  Past Medical History:  Diagnosis Date  . Anxiety    panic disorder  . Hypertension     Past Surgical History:  Procedure Laterality Date  . LUMBAR LAMINECTOMY/DECOMPRESSION MICRODISCECTOMY N/A 02/14/2016   Procedure: MICRO  LUMBER L4-5;  Surgeon: Susa Day, MD;  Location: [facility];  Service: Orthopedics;  Laterality: N/A;  . NO PAST SURGERIES      Family History  Problem Relation Age of Onset  . Hypertension Mother   . Hypertension Father   . Diabetes Father   .  Inflammatory bowel disease Other   . Diabetes Other   . Cancer Neg Hx     Social History  Substance Use Topics  . Smoking status: Never Smoker  . Smokeless tobacco: Never Used  . Alcohol use Yes     Comment: occ    Prior to Admission medications   Medication Sig Start Date End Date Taking? Authorizing Provider  escitalopram (LEXAPRO) 10 MG tablet Take 1 tablet by mouth daily. 01/29/16  Yes Historical Provider, MD  lisinopril (PRINIVIL,ZESTRIL) 20 MG tablet Take 1 tablet by mouth daily. 01/24/16  Yes Historical Provider, MD  magnesium citrate SOLN Take 1 Bottle by mouth once.    Yes Historical Provider, MD  promethazine (PHENERGAN) 12.5 MG tablet Take 12.5 mg by mouth every 6 (six) hours as needed for nausea/vomiting.   Yes Historical Provider, MD  Pseudoephedrine-APAP-DM (DAYQUIL MULTI-SYMPTOM COLD/FLU PO) Take 2 capsules by mouth daily as needed (cough).   Yes Historical Provider, MD  docusate sodium (COLACE) 100 MG capsule Take 1 capsule (100 mg total) by mouth 2 (two) times daily as needed for mild constipation. Patient not taking: Reported on 05/22/2016 02/14/16   Susa Day, MD  methocarbamol (ROBAXIN) 500 MG tablet Take 1 tablet (500 mg total) by mouth every 6 (six) hours as needed for muscle spasms. Patient not taking: Reported on 05/22/2016 02/14/16   Susa Day, MD  oxyCODONE-acetaminophen (PERCOCET) 7.5-325 MG tablet Take 1-2 tablets by mouth every 4 (four) hours as needed for severe pain. Patient not taking: Reported on 05/22/2016 02/14/16   Susa Day, MD  polyethylene glycol [facility] / Floria Raveling) packet Take 17 g by mouth daily. Patient not taking: Reported on 05/22/2016 02/14/16   Susa Day, MD    Current Facility-Administered Medications  Medication Dose Route Frequency Provider Last Rate Last Dose  . 0.9 %  sodium chloride infusion   Intravenous Continuous Charlynne Cousins, MD 50 mL/hr at 05/23/16 0815    . acetaminophen (TYLENOL) tablet 650 mg  650 mg Oral Q6H PRN Toy Baker, MD       Or  . acetaminophen (TYLENOL) suppository 650 mg  650 mg Rectal Q6H PRN Toy Baker, MD      . escitalopram (LEXAPRO) tablet 10 mg  10 mg Oral Daily Toy Baker, MD      . hydrALAZINE (APRESOLINE) injection 5 mg  5 mg Intravenous Q4H PRN Charlynne Cousins, MD   5 mg at 05/23/16 0837  . HYDROcodone-acetaminophen (NORCO/VICODIN) 5-325 MG per tablet 1-2 tablet  1-2 tablet Oral Q4H PRN Toy Baker, MD      . Derrill Memo ON 05/24/2016] lisinopril (PRINIVIL,ZESTRIL) tablet 20 mg  20 mg Oral Daily Charlynne Cousins, MD      . ondansetron [facility])  tablet 4 mg  4 mg Oral Q6H PRN Toy Baker, MD       Or  . ondansetron (ZOFRAN) injection 4 mg  4 mg Intravenous Q6H PRN Toy Baker, MD   4 mg at 05/23/16 3500  . sodium chloride flush (NS) 0.9 % injection 3 mL  3 mL Intravenous Q12H Toy Baker, MD   3 mL at 05/22/16 2315    Allergies as of 05/22/2016 - Review Complete 05/22/2016  Allergen Reaction Noted  . Penicillins Hives 02/06/2016  . Sulfa antibiotics Other (See Comments) 02/06/2016     Review of Systems:     Constitutional: Positive for weight loss of 30 pounds No fever, chills, weakness or fatigue HEENT: Eyes: No change in vision  Ears, Nose, Throat:  No change in hearing or congestion Skin: No rash or itching Cardiovascular: No chest pain, chest pressure or palpitations   Respiratory: No SOB or cough Gastrointestinal: See HPI and otherwise negative Genitourinary: No dysuria or change in urinary frequency Neurological: No headache, dizziness or syncope Musculoskeletal: No new muscle or joint pain Hematologic: No bruising Psychiatric: Positive for anxiety    Physical Exam:  Vital signs in last 24 hours: Temp:  [97.7 F (36.5 C)-98.4 F (36.9 C)] 98.4 F (36.9 C) (11/09 0800) Pulse Rate:  [89-136] 100 (11/09 0114) Resp:  [14-21] 14 (11/09 0753) BP: (124-174)/(67-100) 174/100 (11/09 0753) SpO2:  [97 %-100 %] 98 % (11/09 0753) Weight:  [299 lb 2.6 oz (135.7 kg)-300 lb (136.1 kg)] 299 lb 2.6 oz (135.7 kg) (11/09 0114) Last BM Date: 05/23/16 General:   Pleasant Overweight Caucasian male appears to be in NAD, Well developed, Well nourished, alert and cooperative Head:  Normocephalic and atraumatic. Eyes:   PEERL, EOMI. No icterus. Conjunctiva pink. Ears:  Normal auditory acuity. Neck:  Supple Throat: Oral cavity and pharynx without inflammation, swelling or lesion. Teeth in good condition. Lungs: Respirations even and unlabored. Lungs clear to auscultation bilaterally.   No wheezes,  crackles, or rhonchi.  Heart: Normal S1, S2. No MRG. Regular rate and rhythm. No peripheral edema, cyanosis or pallor.  Abdomen:  Soft, nondistended, mild generalized TTP No rebound or guarding. Normal bowel sounds. No appreciable masses or hepatomegaly. Rectal:  Not performed.  Msk:  Symmetrical without gross deformities. Peripheral pulses intact.  Extremities:  Without edema, no deformity or joint abnormality. Normal ROM. Neurologic:  Alert and  oriented x4;  grossly normal neurologically. Skin:   Dry and intact without significant lesions or rashes. Psychiatric: Oriented to person, place and time. Demonstrates good judgement and reason without abnormal affect or behaviors.   LAB RESULTS:  Recent Labs  05/22/16 1830 05/22/16 2335 05/23/16 0314  WBC 10.5 10.4 8.6  HGB 11.4* 10.8* 10.0*  HCT 33.0* 32.1* 29.7*  PLT 572* 555* 528*   BMET  Recent Labs  05/22/16 1830 05/23/16 0314  NA 133* 136  K 4.2 4.2  CL 102 105  CO2 24 24  GLUCOSE 106* 107*  BUN 10 8  CREATININE 1.06 0.94  CALCIUM 8.8* 8.2*   LFT  Recent Labs  05/23/16 0314  PROT 5.9*  ALBUMIN 3.0*  AST 18  ALT 27  ALKPHOS 43  BILITOT 0.8   PT/INR No results for input(s): LABPROT, INR in the last 72 hours.  STUDIES: Dg Abd Acute W/chest  Result Date: 05/22/2016 CLINICAL DATA:  Nausea vomiting EXAM: DG ABDOMEN ACUTE W/ 1V CHEST COMPARISON:  None. FINDINGS: Single-view chest demonstrates no acute infiltrate or effusion. Normal cardiomediastinal silhouette. No pneumothorax. Supine and upright views of the abdomen demonstrate no free air. There is a nonobstructed gas pattern with few gas-filled nondilated loops of small bowel in the central and left abdomen. There is scattered colonic gas. No pathologic calcifications. IMPRESSION: 1. Single-view chest is within normal limits. 2. Nonobstructed gas pattern Electronically Signed   By: Donavan Foil M.D.   On: 05/22/2016 18:19     PREVIOUS ENDOSCOPIES:              None   Impression / Plan:   Impression: 1. Hematochezia:Off-and-on for the past 3-4 weeks, started around the same time patient was changed from Effexor to Cymbalta, diarrhea for the past 8 weeks, family history of Crohn's in his maternal grandmother, associated  weight loss of 30 pounds over the past 2 months; consider most likely IBD versus other 2. Diarrhea: See above, GI pathogen panel and C. difficile negative in the recent past 3. Abdominal pain: Intermittent generalized abdominal cramping 4. Weight Loss: 30 pounds in the past 2 months, likely due to decrease in appetite and symptoms of nausea and vomiting 5. Dyspepsia: Patient with constant nausea over the past 3-4 weeks and at least 4-5 episodes of vomiting, worsened by mag citrate prep yesterday, no vomiting since admission 6. Anemia: Hemoglobin 11.4 at time of admission yesterday around 6:30 PM, currently 10.0-likely related to acute blood loss  Plan: 1. Will schedule EGD and colonoscopy for further evaluation. Reminded patient of risks, benefits, limitations and alternatives and he agrees to proceed. These will be scheduled for tomorrow to allow patient to prep today. 2. Continue to monitor hemoglobin with transfusion as needed less than 7 3. Patient to remain on clear today and then NPO after midnight. MoviPrep per oders this afternoon. 4. Continue supportive measures including antiemetics 5. Please await further recommendations from Dr. Ardis Hughs  Thank you for your kind consultation, we will continue to follow.  [name]  05/23/2016, 9:02 AM Pager #: [phone]   ________________________________________________________________________  [facility] MD note:  I personally examined the patient, reviewed the data and agree with the assessment and plan described above.  Intermittent bloody diarrhea over the past several weeks and vomiting for the same period.  ?IBD (UC or crohn's).  Planning for colonoscopy and EGD  tomorrow morning as he was going to have at [facility] today by a GI doctor that he met in [place] 1-2 weeks ago.   [name], MD [facility] Gastroenterology Pager [phone]

## 2016-05-23 NOTE — Progress Notes (Signed)
Pt. Unable to tolerate any more of bowel prep due to nausea and vomiting. Per pt and wife his stools were clear. Dr Carlean Purl notified, ordered reglan 10 mg Q6 prn for nausea and tap water enema 500cc in the am. Will continue to monitor.

## 2016-05-23 NOTE — Progress Notes (Signed)
[facility] PROGRESS NOTE    Progress Note  [name]  GOT:[id number] DOB: 12-30-1985 DOA: 05/22/2016 PCP: Wonda Cerise, MD     Brief Narrative:   [name] is an 30 y.o. male past medical history of hypertension that came into the hospital for several weeks of watery diarrhea 10-15 stools a day which some are bloody. Accompanied by abdominal pain and cramping. He went and saw his GI doctor who tested for C. difficile which was negative, he is scheduled to have colonoscopy on 05/23/2016 that his nausea vomiting and diarrhea progressively got worse and he had to come to the ED.  Assessment/Plan:  Nausea vomiting and diarrhea/ Blood in stool, frank: GI to see, seems to be likely inflammatory bowel disease, will keep him nothing by mouth, for possible colonoscopy for further evaluation. Decrease with IV fluids rate, continue check CBCs. Can be transferred to [place] telemetry. Mild drop in hemoglobin likely hemodilution affect.  Thrombocytosis: Likely acting as an acute phase reactant.  Dehydration Resolved with IV fluid hydration.  GI bleeding: Mild drop in hemoglobin will continue to trend.  Essential hypertension: Oral antihypertensive medications were held, we'll resume tomorrow morning. We'll add hydralazine IV when necessary for systolic blood pressure greater than 150.  DVT prophylaxis: SCD's Family Communication:none Disposition Plan/Barrier to D/C: unable to determine Code Status:     Code Status Orders        Start     Ordered   05/22/16 2312  Full code  Continuous     05/22/16 2311    Code Status History    Date Active Date Inactive Code Status Order ID Comments User Context   02/14/2016 12:53 PM 02/15/2016  2:21 PM Full Code [id number]  Susa Day, MD Inpatient        IV Access:    Peripheral IV   Procedures and diagnostic studies:   Dg Abd Acute W/chest  Result Date: 05/22/2016 CLINICAL DATA:  Nausea vomiting  EXAM: DG ABDOMEN ACUTE W/ 1V CHEST COMPARISON:  None. FINDINGS: Single-view chest demonstrates no acute infiltrate or effusion. Normal cardiomediastinal silhouette. No pneumothorax. Supine and upright views of the abdomen demonstrate no free air. There is a nonobstructed gas pattern with few gas-filled nondilated loops of small bowel in the central and left abdomen. There is scattered colonic gas. No pathologic calcifications. IMPRESSION: 1. Single-view chest is within normal limits. 2. Nonobstructed gas pattern Electronically Signed   By: Donavan Foil M.D.   On: 05/22/2016 18:19     Medical Consultants:    None.  Anti-Infectives:   none  Subjective:    [name] still nauseated no abdominal pain.  Objective:    Vitals:   05/23/16 0100 05/23/16 0114 05/23/16 0407 05/23/16 0414  BP: 132/76 (!) 144/93  (!) 145/67  Pulse: 93 100    Resp: 21 15    Temp:  98.4 F (36.9 C) 98.1 F (36.7 C)   TempSrc:  Oral Oral   SpO2: 97% 98%    Weight:  135.7 kg (299 lb 2.6 oz)    Height:  6' 3"  (1.905 m)      Intake/Output Summary (Last 24 hours) at 05/23/16 0800 Last data filed at 05/23/16 0600  Gross per 24 hour  Intake          2591.67 ml  Output              150 ml  Net  2441.67 ml   Filed Weights   05/22/16 1651 05/23/16 0114  Weight: 136.1 kg (300 lb) 135.7 kg (299 lb 2.6 oz)    Exam: General exam: In no acute distress. Respiratory system: Good air movement and clear to auscultation. Cardiovascular system: S1 & S2 heard, RRR. Gastrointestinal system: Abdomen is nondistended, soft and nontender.  Central nervous system: Alert and oriented. No focal neurological deficits. Extremities: No pedal edema. Skin: No rashes, lesions or ulcers Psychiatry: Judgement and insight appear normal. Mood & affect appropriate.    Data Reviewed:    Labs: Basic Metabolic Panel:  Recent Labs Lab 05/22/16 1830 05/23/16 0314  NA 133* 136  K 4.2 4.2  CL 102 105  CO2 24  24  GLUCOSE 106* 107*  BUN 10 8  CREATININE 1.06 0.94  CALCIUM 8.8* 8.2*  MG  --  2.2  PHOS  --  3.5   GFR Estimated Creatinine Clearance: 169.1 mL/min (by C-G formula based on SCr of 0.94 mg/dL). Liver Function Tests:  Recent Labs Lab 05/22/16 1830 05/23/16 0314  AST 23 18  ALT 36 27  ALKPHOS 52 43  BILITOT 0.6 0.8  PROT 7.0 5.9*  ALBUMIN 3.7 3.0*    Recent Labs Lab 05/22/16 1830  LIPASE 26   No results for input(s): AMMONIA in the last 168 hours. Coagulation profile No results for input(s): INR, PROTIME in the last 168 hours.  CBC:  Recent Labs Lab 05/22/16 1830 05/22/16 2335 05/23/16 0314  WBC 10.5 10.4 8.6  HGB 11.4* 10.8* 10.0*  HCT 33.0* 32.1* 29.7*  MCV 82.5 84.5 85.1  PLT 572* 555* 528*   Cardiac Enzymes: No results for input(s): CKTOTAL, CKMB, CKMBINDEX, TROPONINI in the last 168 hours. BNP (last 3 results) No results for input(s): PROBNP in the last 8760 hours. CBG: No results for input(s): GLUCAP in the last 168 hours. D-Dimer: No results for input(s): DDIMER in the last 72 hours. Hgb A1c: No results for input(s): HGBA1C in the last 72 hours. Lipid Profile: No results for input(s): CHOL, HDL, LDLCALC, TRIG, CHOLHDL, LDLDIRECT in the last 72 hours. Thyroid function studies:  Recent Labs  05/23/16 0314  TSH 1.944   Anemia work up: No results for input(s): VITAMINB12, FOLATE, FERRITIN, TIBC, IRON, RETICCTPCT in the last 72 hours. Sepsis Labs:  Recent Labs Lab 05/22/16 1830 05/22/16 2335 05/23/16 0314  WBC 10.5 10.4 8.6   Microbiology Recent Results (from the past 240 hour(s))  MRSA PCR Screening     Status: None   Collection Time: 05/23/16  1:15 AM  Result Value Ref Range Status   MRSA by PCR NEGATIVE NEGATIVE Final    Comment:        The GeneXpert MRSA Assay (FDA approved for NASAL specimens only), is one component of a comprehensive MRSA colonization surveillance program. It is not intended to diagnose MRSA infection  nor to guide or monitor treatment for MRSA infections.      Medications:   . escitalopram  10 mg Oral Daily  . sodium chloride flush  3 mL Intravenous Q12H   Continuous Infusions: . sodium chloride 125 mL/hr at 05/23/16 0600    Time spent: 25 min   LOS: 1 day   Charlynne Cousins  [facility] Pager [phone]  *Please refer to [email or web address], password [email or web address] to get updated schedule on who will round on this patient, as hospitalists switch teams weekly. If 7PM-7AM, please contact night-coverage at [email or web address], password [email or web address] for any overnight needs.  05/23/2016, 8:00 AM

## 2016-05-24 ENCOUNTER — Encounter (HOSPITAL_COMMUNITY): Payer: Self-pay

## 2016-05-24 ENCOUNTER — Encounter (HOSPITAL_COMMUNITY)
Admission: EM | Disposition: A | Discharge status: Home / Self Care | Payer: Self-pay | Source: Home / Self Care | Attending: Internal Medicine

## 2016-05-24 ENCOUNTER — Telehealth: Payer: Self-pay

## 2016-05-24 DIAGNOSIS — K5100 Ulcerative (chronic) pancolitis without complications: Secondary | ICD-10-CM

## 2016-05-24 DIAGNOSIS — K922 Gastrointestinal hemorrhage, unspecified: Secondary | ICD-10-CM

## 2016-05-24 HISTORY — PX: ESOPHAGOGASTRODUODENOSCOPY: SHX5428

## 2016-05-24 HISTORY — PX: COLONOSCOPY: SHX5424

## 2016-05-24 LAB — C DIFFICILE QUICK SCREEN W PCR REFLEX
C DIFFICILE (CDIFF) INTERP: NOT DETECTED
C Diff antigen: NEGATIVE
C Diff toxin: NEGATIVE

## 2016-05-24 LAB — CLOSTRIDIUM DIFFICILE BY PCR: Toxigenic C. Difficile by PCR: NEGATIVE

## 2016-05-24 SURGERY — EGD (ESOPHAGOGASTRODUODENOSCOPY)
Anesthesia: Moderate Sedation

## 2016-05-24 MED ORDER — FENTANYL CITRATE (PF) 100 MCG/2ML IJ SOLN
INTRAMUSCULAR | Status: DC | PRN
Start: 2016-05-24 — End: 2016-05-24
  Administered 2016-05-24 (×4): 25 ug via INTRAVENOUS

## 2016-05-24 MED ORDER — DIPHENHYDRAMINE HCL 50 MG/ML IJ SOLN
INTRAMUSCULAR | Status: DC | PRN
  Administered 2016-05-24: 25 mg via INTRAVENOUS

## 2016-05-24 MED ORDER — PROMETHAZINE HCL 12.5 MG PO TABS: 25.0000 mg | ORAL_TABLET | Freq: Four times a day (QID) | ORAL | 0 refills | Status: DC | PRN

## 2016-05-24 MED ORDER — MESALAMINE 1.2 G PO TBEC: 4.8000 g | DELAYED_RELEASE_TABLET | Freq: Every day | ORAL | 3 refills | Status: DC

## 2016-05-24 MED ORDER — MIDAZOLAM HCL 5 MG/ML IJ SOLN
INTRAMUSCULAR | Status: AC
  Filled 2016-05-24: qty 1

## 2016-05-24 MED ORDER — PREDNISONE 20 MG PO TABS
40.0000 mg | ORAL_TABLET | Freq: Every day | ORAL | Status: DC
  Administered 2016-05-24: 40 mg via ORAL
  Filled 2016-05-24: qty 2

## 2016-05-24 MED ORDER — PREDNISONE 20 MG PO TABS: 40.0000 mg | ORAL_TABLET | Freq: Every day | ORAL | 0 refills | Status: DC

## 2016-05-24 MED ORDER — FENTANYL CITRATE (PF) 100 MCG/2ML IJ SOLN
INTRAMUSCULAR | Status: AC
  Filled 2016-05-24: qty 2

## 2016-05-24 MED ORDER — DIPHENHYDRAMINE HCL 50 MG/ML IJ SOLN
INTRAMUSCULAR | Status: AC
  Filled 2016-05-24: qty 1

## 2016-05-24 MED ORDER — MIDAZOLAM HCL 5 MG/ML IJ SOLN
INTRAMUSCULAR | Status: AC
  Filled 2016-05-24: qty 2

## 2016-05-24 MED ORDER — BUTAMBEN-TETRACAINE-BENZOCAINE 2-2-14 % EX AERO
INHALATION_SPRAY | CUTANEOUS | Status: DC | PRN
  Administered 2016-05-24: 2 via TOPICAL

## 2016-05-24 MED ORDER — MIDAZOLAM HCL 5 MG/5ML IJ SOLN
INTRAMUSCULAR | Status: DC | PRN
  Administered 2016-05-24 (×5): 2 mg via INTRAVENOUS

## 2016-05-24 MED ORDER — MESALAMINE 1.2 G PO TBEC
4.8000 g | DELAYED_RELEASE_TABLET | Freq: Every day | ORAL | Status: DC
  Administered 2016-05-24: 4.8 g via ORAL
  Filled 2016-05-24: qty 4

## 2016-05-24 NOTE — Telephone Encounter (Signed)
-----   Message from [name], MD sent at 05/24/2016  9:11 AM EST ----- He is in hospital. Wants to change from [facility] to me.   He needs rov with me or extender in 3-4 weeks for hospital follow up, new diagnosis of extensive pan UC.  thanks

## 2016-05-24 NOTE — Telephone Encounter (Signed)
Left message on machine to call back  

## 2016-05-24 NOTE — Progress Notes (Signed)
Tap water enema given this am with clear output no bleeding noted.

## 2016-05-24 NOTE — Interval H&P Note (Signed)
History and Physical Interval Note:  05/24/2016 7:48 AM  [name]  has presented today for surgery, with the diagnosis of Dyspepsia, Weight Loss, Hematochezia, Diarrhea  The various methods of treatment have been discussed with the patient and family. After consideration of risks, benefits and other options for treatment, the patient has consented to  Procedure(s): ESOPHAGOGASTRODUODENOSCOPY (EGD) (N/A) COLONOSCOPY (N/A) as a surgical intervention .  The patient's history has been reviewed, patient examined, no change in status, stable for surgery.  I have reviewed the patient's chart and labs.  Questions were answered to the patient's satisfaction.     [name]

## 2016-05-24 NOTE — H&P (View-Only) (Signed)
Consultation  Referring Provider:  Dr. Aileen Fass    Primary Care Physician:  Wonda Cerise, MD Primary Gastroenterologist:  Althia Forts       Reason for Consultation: Hematochezia, Diarrhea, Dyspepsia          HPI:   [name] is a 30 y.o. Caucasian male with a past medical history of anxiety and hypertension who presented to the ER on 05/22/16 with uncontrollable nausea and vomiting as well as abdominal pain and hematochezia.    Per chart review patient has been seeing [facility] in regards to his symptoms. He was scheduled for an EGD and colonoscopy this morning. He has previously had stool studies including a GI pathogen panel and C. difficile which were negative.    Today, the patient tells me that 8 weeks ago he was switched from Lexapro to Effexor by his general practitioner and started with diarrhea. He tells me that he'll have 3-4 liquid bowel movements after eating or drinking anything. He had this for about 3-4 weeks and was then changed to Cymbalta, he then started seeing occasional bright red blood in his stools and was changed back to Lexapro. GI pathogen panel was done by his general practitioner which was negative and he was then referred to GI. The GI office completed a C. difficile test which was negative and it was recommended that he have an EGD and colonoscopy for his symptoms. The patient started his prep yesterday around 2 PM and drank the first of bottle of magnesium citrate but about 30 minutes later he started vomiting, this continued and the patient could not control this. He called the clinic and was told to proceed to the ER.     The patient tells me that he has had diarrhea at least 3-4 times after he eats or drinks anything. Most recently he has had 5-6 bowel movements since admission and tells me that they are "reddish clear with clots", he denies seeing any brown stool. Associated symptoms include a weight loss of almost 30 pounds over the past 2  months. Patient also tells me that about 3 weeks ago he developed nausea which led to a decrease in appetite. He tells me that he also vomits occasionally, he has had at least 4-5 episodes over the past 3 weeks. His wife tells me this is typically when he "overeats because he is so hungry". The patient tells me that first it will start with belching, which then leads to nausea and gagging and eventually he will vomit. The patient was on Zantac prior to all of this 8 weeks ago, but then they decided that his panic disorders are causing his symptoms and stopped this medication. He denies heartburn or reflux. He also describes intermittent generalized abdominal cramping  Patient's family history is positive for Crohn's in his maternal grandmother.  Patient denies fever, chills, use of NSAIDs or previous episodes of the same prior to 8 weeks ago.  Past Medical History:  Diagnosis Date  . Anxiety    panic disorder  . Hypertension     Past Surgical History:  Procedure Laterality Date  . LUMBAR LAMINECTOMY/DECOMPRESSION MICRODISCECTOMY N/A 02/14/2016   Procedure: MICRO  LUMBER L4-5;  Surgeon: Susa Day, MD;  Location: [facility]S;  Service: Orthopedics;  Laterality: N/A;  . NO PAST SURGERIES      Family History  Problem Relation Age of Onset  . Hypertension Mother   . Hypertension Father   . Diabetes Father   .  Inflammatory bowel disease Other   . Diabetes Other   . Cancer Neg Hx     Social History  Substance Use Topics  . Smoking status: Never Smoker  . Smokeless tobacco: Never Used  . Alcohol use Yes     Comment: occ    Prior to Admission medications   Medication Sig Start Date End Date Taking? Authorizing Provider  escitalopram (LEXAPRO) 10 MG tablet Take 1 tablet by mouth daily. 01/29/16  Yes Historical Provider, MD  lisinopril (PRINIVIL,ZESTRIL) 20 MG tablet Take 1 tablet by mouth daily. 01/24/16  Yes Historical Provider, MD  magnesium citrate SOLN Take 1 Bottle by mouth once.    Yes Historical Provider, MD  promethazine (PHENERGAN) 12.5 MG tablet Take 12.5 mg by mouth every 6 (six) hours as needed for nausea/vomiting.   Yes Historical Provider, MD  Pseudoephedrine-APAP-DM (DAYQUIL MULTI-SYMPTOM COLD/FLU PO) Take 2 capsules by mouth daily as needed (cough).   Yes Historical Provider, MD  docusate sodium (COLACE) 100 MG capsule Take 1 capsule (100 mg total) by mouth 2 (two) times daily as needed for mild constipation. Patient not taking: Reported on 05/22/2016 02/14/16   Susa Day, MD  methocarbamol (ROBAXIN) 500 MG tablet Take 1 tablet (500 mg total) by mouth every 6 (six) hours as needed for muscle spasms. Patient not taking: Reported on 05/22/2016 02/14/16   Susa Day, MD  oxyCODONE-acetaminophen (PERCOCET) 7.5-325 MG tablet Take 1-2 tablets by mouth every 4 (four) hours as needed for severe pain. Patient not taking: Reported on 05/22/2016 02/14/16   Susa Day, MD  polyethylene glycol [facility] Floria Raveling) packet Take 17 g by mouth daily. Patient not taking: Reported on 05/22/2016 02/14/16   Susa Day, MD    Current Facility-Administered Medications  Medication Dose Route Frequency Provider Last Rate Last Dose  . 0.9 %  sodium chloride infusion   Intravenous Continuous Charlynne Cousins, MD 50 mL/hr at 05/23/16 0815    . acetaminophen (TYLENOL) tablet 650 mg  650 mg Oral Q6H PRN Toy Baker, MD       Or  . acetaminophen (TYLENOL) suppository 650 mg  650 mg Rectal Q6H PRN Toy Baker, MD      . escitalopram (LEXAPRO) tablet 10 mg  10 mg Oral Daily Toy Baker, MD      . hydrALAZINE (APRESOLINE) injection 5 mg  5 mg Intravenous Q4H PRN Charlynne Cousins, MD   5 mg at 05/23/16 0837  . HYDROcodone-acetaminophen (NORCO/VICODIN) 5-325 MG per tablet 1-2 tablet  1-2 tablet Oral Q4H PRN Toy Baker, MD      . Derrill Memo ON 05/24/2016] lisinopril (PRINIVIL,ZESTRIL) tablet 20 mg  20 mg Oral Daily Charlynne Cousins, MD      . ondansetron [facility])  tablet 4 mg  4 mg Oral Q6H PRN Toy Baker, MD       Or  . ondansetron (ZOFRAN) injection 4 mg  4 mg Intravenous Q6H PRN Toy Baker, MD   4 mg at 05/23/16 3500  . sodium chloride flush (NS) 0.9 % injection 3 mL  3 mL Intravenous Q12H Toy Baker, MD   3 mL at 05/22/16 2315    Allergies as of 05/22/2016 - Review Complete 05/22/2016  Allergen Reaction Noted  . Penicillins Hives 02/06/2016  . Sulfa antibiotics Other (See Comments) 02/06/2016     Review of Systems:     Constitutional: Positive for weight loss of 30 pounds No fever, chills, weakness or fatigue HEENT: Eyes: No change in vision  Ears, Nose, Throat:  No change in hearing or congestion Skin: No rash or itching Cardiovascular: No chest pain, chest pressure or palpitations   Respiratory: No SOB or cough Gastrointestinal: See HPI and otherwise negative Genitourinary: No dysuria or change in urinary frequency Neurological: No headache, dizziness or syncope Musculoskeletal: No new muscle or joint pain Hematologic: No bruising Psychiatric: Positive for anxiety    Physical Exam:  Vital signs in last 24 hours: Temp:  [97.7 F (36.5 C)-98.4 F (36.9 C)] 98.4 F (36.9 C) (11/09 0800) Pulse Rate:  [89-136] 100 (11/09 0114) Resp:  [14-21] 14 (11/09 0753) BP: (124-174)/(67-100) 174/100 (11/09 0753) SpO2:  [97 %-100 %] 98 % (11/09 0753) Weight:  [299 lb 2.6 oz (135.7 kg)-300 lb (136.1 kg)] 299 lb 2.6 oz (135.7 kg) (11/09 0114) Last BM Date: 05/23/16 General:   Pleasant Overweight Caucasian male appears to be in NAD, Well developed, Well nourished, alert and cooperative Head:  Normocephalic and atraumatic. Eyes:   PEERL, EOMI. No icterus. Conjunctiva pink. Ears:  Normal auditory acuity. Neck:  Supple Throat: Oral cavity and pharynx without inflammation, swelling or lesion. Teeth in good condition. Lungs: Respirations even and unlabored. Lungs clear to auscultation bilaterally.   No wheezes,  crackles, or rhonchi.  Heart: Normal S1, S2. No MRG. Regular rate and rhythm. No peripheral edema, cyanosis or pallor.  Abdomen:  Soft, nondistended, mild generalized TTP No rebound or guarding. Normal bowel sounds. No appreciable masses or hepatomegaly. Rectal:  Not performed.  Msk:  Symmetrical without gross deformities. Peripheral pulses intact.  Extremities:  Without edema, no deformity or joint abnormality. Normal ROM. Neurologic:  Alert and  oriented x4;  grossly normal neurologically. Skin:   Dry and intact without significant lesions or rashes. Psychiatric: Oriented to person, place and time. Demonstrates good judgement and reason without abnormal affect or behaviors.   LAB RESULTS:  Recent Labs  05/22/16 1830 05/22/16 2335 05/23/16 0314  WBC 10.5 10.4 8.6  HGB 11.4* 10.8* 10.0*  HCT 33.0* 32.1* 29.7*  PLT 572* 555* 528*   BMET  Recent Labs  05/22/16 1830 05/23/16 0314  NA 133* 136  K 4.2 4.2  CL 102 105  CO2 24 24  GLUCOSE 106* 107*  BUN 10 8  CREATININE 1.06 0.94  CALCIUM 8.8* 8.2*   LFT  Recent Labs  05/23/16 0314  PROT 5.9*  ALBUMIN 3.0*  AST 18  ALT 27  ALKPHOS 43  BILITOT 0.8   PT/INR No results for input(s): LABPROT, INR in the last 72 hours.  STUDIES: Dg Abd Acute W/chest  Result Date: 05/22/2016 CLINICAL DATA:  Nausea vomiting EXAM: DG ABDOMEN ACUTE W/ 1V CHEST COMPARISON:  None. FINDINGS: Single-view chest demonstrates no acute infiltrate or effusion. Normal cardiomediastinal silhouette. No pneumothorax. Supine and upright views of the abdomen demonstrate no free air. There is a nonobstructed gas pattern with few gas-filled nondilated loops of small bowel in the central and left abdomen. There is scattered colonic gas. No pathologic calcifications. IMPRESSION: 1. Single-view chest is within normal limits. 2. Nonobstructed gas pattern Electronically Signed   By: Donavan Foil M.D.   On: 05/22/2016 18:19     PREVIOUS ENDOSCOPIES:              None   Impression / Plan:   Impression: 1. Hematochezia:Off-and-on for the past 3-4 weeks, started around the same time patient was changed from Effexor to Cymbalta, diarrhea for the past 8 weeks, family history of Crohn's in his maternal grandmother, associated  weight loss of 30 pounds over the past 2 months; consider most likely IBD versus other 2. Diarrhea: See above, GI pathogen panel and C. difficile negative in the recent past 3. Abdominal pain: Intermittent generalized abdominal cramping 4. Weight Loss: 30 pounds in the past 2 months, likely due to decrease in appetite and symptoms of nausea and vomiting 5. Dyspepsia: Patient with constant nausea over the past 3-4 weeks and at least 4-5 episodes of vomiting, worsened by mag citrate prep yesterday, no vomiting since admission 6. Anemia: Hemoglobin 11.4 at time of admission yesterday around 6:30 PM, currently 10.0-likely related to acute blood loss  Plan: 1. Will schedule EGD and colonoscopy for further evaluation. Reminded patient of risks, benefits, limitations and alternatives and he agrees to proceed. These will be scheduled for tomorrow to allow patient to prep today. 2. Continue to monitor hemoglobin with transfusion as needed less than 7 3. Patient to remain on clear today and then NPO after midnight. MoviPrep per oders this afternoon. 4. Continue supportive measures including antiemetics 5. Please await further recommendations from Dr. Ardis Hughs  Thank you for your kind consultation, we will continue to follow.  Lavone Nian Lemmon  05/23/2016, 9:02 AM Pager #: [phone]   ________________________________________________________________________  [facility] MD note:  I personally examined the patient, reviewed the data and agree with the assessment and plan described above.  Intermittent bloody diarrhea over the past several weeks and vomiting for the same period.  ?IBD (UC or crohn's).  Planning for colonoscopy and EGD  tomorrow morning as he was going to have at [facility] today by a GI doctor that he met in [place] 1-2 weeks ago.   [name], MD [facility] Pager [phone]

## 2016-05-24 NOTE — Telephone Encounter (Signed)
06/21/16 845 am appt with Dr Ardis Hughs letter mailed

## 2016-05-24 NOTE — Progress Notes (Signed)
I reviewed his colonsocopy, EGD reports in recovery with him, his wife and mother.  He wants to follow up with me instead of [facility], he was not very happy with his encounter there.  My office will contact him about follow up appt in 3-4 weeks (with myself of an extender).

## 2016-05-24 NOTE — Discharge Summary (Addendum)
Physician Discharge Summary  [name] DUK:[id number] DOB: July 07, 1986 DOA: 05/22/2016  PCP: Wonda Cerise, MD  Admit date: 05/22/2016 Discharge date: 05/24/2016  Admitted From: home Disposition:  Home  Recommendations for Outpatient Follow-up:  1. Follow up with GI in 4 weeks.  Home Health:no Equipment/Devices:none  Discharge Condition:stable CODE STATUS:full Diet recommendation: Heart Healthy   Brief/Interim Summary: 30 y.o. male past medical history of hypertension that came into the hospital for several weeks of watery diarrhea 10-15 stools a day which some are bloody. Accompanied by abdominal pain and cramping. He went and saw his GI doctor who tested for C. difficile which was negative, he is scheduled to have colonoscopy on 05/23/2016 that his nausea vomiting and diarrhea progressively got worse and he had to come to the ED.  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 Started mesalamine and steroids. Will follow up with GI as an outpatient for Biopsy results. Nausea and vomiting resolved tolerating diet.  Thrombocytosis: Likely acting as an acute phase reactant.  Dehydration Resolved with IV fluid hydration   Discharge Instructions  Discharge Instructions    Call MD for:  persistant nausea and vomiting    Complete by:  As directed    Call MD for:  temperature >100.4    Complete by:  As directed    Diet - low sodium heart healthy    Complete by:  As directed    Increase activity slowly    Complete by:  As directed        Medication List    STOP taking these medications   docusate sodium 100 MG capsule Commonly known as:  COLACE   magnesium  citrate Soln   polyethylene glycol packet Commonly known as:  MIRALAX / GLYCOLAX     TAKE these medications   DAYQUIL MULTI-SYMPTOM COLD/FLU PO Take 2 capsules by mouth daily as needed (cough).   escitalopram 10 MG tablet Commonly known as:  LEXAPRO Take 10 mg by mouth daily.   lisinopril 20 MG tablet Commonly known as:  PRINIVIL,ZESTRIL Take 20 mg by mouth daily.   mesalamine 1.2 g EC tablet Commonly known as:  LIALDA Take 4 tablets (4.8 g total) by mouth daily with breakfast. Start taking on:  05/25/2016   methocarbamol 500 MG tablet Commonly known as:  ROBAXIN Take 1 tablet (500 mg total) by mouth every 6 (six) hours as needed for muscle spasms.   oxyCODONE-acetaminophen 7.5-325 MG tablet Commonly known as:  PERCOCET Take 1-2 tablets by mouth every 4 (four) hours as needed for severe pain.   predniSONE 20 MG tablet Commonly known as:  DELTASONE Take 2 tablets (40 mg total) by mouth daily before breakfast. Start taking on:  05/25/2016   promethazine 12.5 MG tablet Commonly known as:  PHENERGAN Take 2 tablets (25 mg total) by mouth every 6 (six) hours as needed. What changed:  how much to take  reasons to take this       Allergies  Allergen Reactions  . Penicillins Hives    Has patient had a PCN reaction causing immediate rash, facial/tongue/throat swelling, SOB or lightheadedness with hypotension: no Has patient had a PCN reaction causing severe rash involving mucus membranes or skin  necrosis: no Has patient had a PCN reaction that required hospitalization no Has patient had a PCN reaction occurring within the last 10 years: yes If all of the above answers are "NO", then may proceed with Cephalosporin use.  . Sulfa Antibiotics Other (See Comments)    unsure    Consultations:  [facility] dr. Ardis Hughs   Procedures/Studies: Dg Abd Acute W/chest  Result Date: 05/22/2016 CLINICAL DATA:  Nausea vomiting EXAM: DG ABDOMEN ACUTE W/ 1V CHEST COMPARISON:   None. FINDINGS: Single-view chest demonstrates no acute infiltrate or effusion. Normal cardiomediastinal silhouette. No pneumothorax. Supine and upright views of the abdomen demonstrate no free air. There is a nonobstructed gas pattern with few gas-filled nondilated loops of small bowel in the central and left abdomen. There is scattered colonic gas. No pathologic calcifications. IMPRESSION: 1. Single-view chest is within normal limits. 2. Nonobstructed gas pattern Electronically Signed   By: Donavan Foil M.D.   On: 05/22/2016 18:19       Subjective: No complains  Discharge Exam: Vitals:   05/24/16 0914 05/24/16 1338  BP: (!) 160/87 126/72  Pulse:  (!) 101  Resp:  18  Temp:  98.2 F (36.8 C)   Vitals:   05/24/16 0905 05/24/16 0910 05/24/16 0914 05/24/16 1338  BP:  (!) 162/80 (!) 160/87 126/72  Pulse: (!) 110 (!) 108  (!) 101  Resp: 18 15  18   Temp:    98.2 F (36.8 C)  TempSrc:    Oral  SpO2: 96% 97%  97%  Weight:      Height:        General: Pt is alert, awake, not in acute distress Cardiovascular: RRR, S1/S2 +, no rubs, no gallops Respiratory: CTA bilaterally, no wheezing, no rhonchi Abdominal: Soft, NT, ND, bowel sounds + Extremities: no edema, no cyanosis    The results of significant diagnostics from this hospitalization (including imaging, microbiology, ancillary and laboratory) are listed below for reference.     Microbiology: Recent Results (from the past 240 hour(s))  MRSA PCR Screening     Status: None   Collection Time: 05/23/16  1:15 AM  Result Value Ref Range Status   MRSA by PCR NEGATIVE NEGATIVE Final    Comment:        The GeneXpert MRSA Assay (FDA approved for NASAL specimens only), is one component of a comprehensive MRSA colonization surveillance program. It is not intended to diagnose MRSA infection nor to guide or monitor treatment for MRSA infections.   C difficile quick screen w PCR reflex     Status: None   Collection Time: 05/24/16   8:51 AM  Result Value Ref Range Status   C Diff antigen NEGATIVE NEGATIVE Final   C Diff toxin NEGATIVE NEGATIVE Final   C Diff interpretation No C. difficile detected.  Final     Labs: BNP (last 3 results) No results for input(s): BNP in the last 8760 hours. Basic Metabolic Panel:  Recent Labs Lab 05/22/16 1830 05/23/16 0314  NA 133* 136  K 4.2 4.2  CL 102 105  CO2 24 24  GLUCOSE 106* 107*  BUN 10 8  CREATININE 1.06 0.94  CALCIUM 8.8* 8.2*  MG  --  2.2  PHOS  --  3.5   Liver Function Tests:  Recent Labs Lab 05/22/16 1830 05/23/16 0314  AST 23 18  ALT 36 27  ALKPHOS 52 43  BILITOT 0.6 0.8  PROT 7.0 5.9*  ALBUMIN 3.7 3.0*    Recent Labs  Lab 05/22/16 1830  LIPASE 26   No results for input(s): AMMONIA in the last 168 hours. CBC:  Recent Labs Lab 05/22/16 1830 05/22/16 2335 05/23/16 0314 05/23/16 1142  WBC 10.5 10.4 8.6 9.7  HGB 11.4* 10.8* 10.0* 11.0*  HCT 33.0* 32.1* 29.7* 32.9*  MCV 82.5 84.5 85.1 85.2  PLT 572* 555* 528* 567*   Cardiac Enzymes: No results for input(s): CKTOTAL, CKMB, CKMBINDEX, TROPONINI in the last 168 hours. BNP: Invalid input(s): POCBNP CBG: No results for input(s): GLUCAP in the last 168 hours. D-Dimer No results for input(s): DDIMER in the last 72 hours. Hgb A1c No results for input(s): HGBA1C in the last 72 hours. Lipid Profile No results for input(s): CHOL, HDL, LDLCALC, TRIG, CHOLHDL, LDLDIRECT in the last 72 hours. Thyroid function studies  Recent Labs  05/23/16 0314  TSH 1.944   Anemia work up No results for input(s): VITAMINB12, FOLATE, FERRITIN, TIBC, IRON, RETICCTPCT in the last 72 hours. Urinalysis    Component Value Date/Time   COLORURINE YELLOW 05/22/2016 1909   APPEARANCEUR CLEAR 05/22/2016 1909   LABSPEC 1.026 05/22/2016 1909   PHURINE 6.0 05/22/2016 1909   GLUCOSEU NEGATIVE 05/22/2016 1909   HGBUR NEGATIVE 05/22/2016 1909   BILIRUBINUR SMALL (A) 05/22/2016 1909   KETONESUR 15 (A)  05/22/2016 1909   PROTEINUR NEGATIVE 05/22/2016 1909   NITRITE NEGATIVE 05/22/2016 1909   LEUKOCYTESUR NEGATIVE 05/22/2016 1909   Sepsis Labs Invalid input(s): PROCALCITONIN,  WBC,  LACTICIDVEN Microbiology Recent Results (from the past 240 hour(s))  MRSA PCR Screening     Status: None   Collection Time: 05/23/16  1:15 AM  Result Value Ref Range Status   MRSA by PCR NEGATIVE NEGATIVE Final    Comment:        The GeneXpert MRSA Assay (FDA approved for NASAL specimens only), is one component of a comprehensive MRSA colonization surveillance program. It is not intended to diagnose MRSA infection nor to guide or monitor treatment for MRSA infections.   C difficile quick screen w PCR reflex     Status: None   Collection Time: 05/24/16  8:51 AM  Result Value Ref Range Status   C Diff antigen NEGATIVE NEGATIVE Final   C Diff toxin NEGATIVE NEGATIVE Final   C Diff interpretation No C. difficile detected.  Final     Time coordinating discharge: Over 30 minutes  SIGNED:   Charlynne Cousins, MD  [facility] 05/24/2016, 1:48 PM Pager   If 7PM-7AM, please contact night-coverage [email or web address] Password [email or web address]

## 2016-05-24 NOTE — Progress Notes (Signed)
Patient given discharge instructions, and verbalized an understanding of all discharge instructions.  Patient agrees with discharge plan, and is being discharged in stable medical condition.  

## 2016-05-24 NOTE — Op Note (Addendum)
[facility] Patient Name: [name] Procedure Date: 05/24/2016 MRN: [id number] Attending MD: [name] , MD Date of Birth: 1986/05/17 CSN: [id number] Age: 30 Admit Type: Inpatient Procedure:                Colonoscopy Indications:              Chronic diarrhea, Rectal bleeding; (Outside workup;                            [place] gastroenterologist was planning                            colonoscopy this week; C. diff by PCR, toxin was                            neg 2 weeks ago; GI pathogen panel was neg about                            3-4 weeks ago) Providers:                [name], MD, Cleda Daub, RN, [facility], Technician Referring MD:              Medicines:                Fentanyl 50 micrograms IV, Midazolam 6 mg IV,                            Diphenhydramine 25 mg IV Complications:            No immediate complications. Estimated blood loss:                            None. Estimated Blood Loss:     Estimated blood loss: none. Procedure:                Pre-Anesthesia Assessment:                           - Prior to the procedure, a History and Physical                            was performed, and patient medications and                            allergies were reviewed. The patient's tolerance of                            previous anesthesia was also reviewed. The risks                            and benefits of the procedure and the sedation  options and risks were discussed with the patient.                            All questions were answered, and informed consent                            was obtained. Prior Anticoagulants: The patient has                            taken no previous anticoagulant or antiplatelet                            agents. ASA Grade Assessment: II - A patient with                            mild systemic disease. After reviewing the risks                             and benefits, the patient was deemed in                            satisfactory condition to undergo the procedure.                           After obtaining informed consent, the colonoscope                            was passed under direct vision. Throughout the                            procedure, the patient's blood pressure, pulse, and                            oxygen saturations were monitored continuously. The                            was introduced through the anus and advanced to the                            the terminal ileum. The colonoscopy was performed                            without difficulty. The patient tolerated the                            procedure well. The quality of the bowel                            preparation was good. The terminal ileum, ileocecal                            valve, appendiceal orifice, and rectum were  photographed. Scope In: 8:22:09 AM Scope Out: 8:33:50 AM Scope Withdrawal Time: 0 hours 9 minutes 8 seconds  Total Procedure Duration: 0 hours 11 minutes 41 seconds  Findings:      The terminal ileum appeared normal.      Severe inflammation characterized by erythema, friability, granularity,       mucus and aphthous ulcerations was found in the entire colon (from       distal rectum to cecum). Biopsies were taken with a cold forceps for       histology (three jars: right colon, left colon, rectum). In the left       colon there was a slight psuedomembranous look to the severe colitis.       This may just be mucopurelence. C. diff testing by his [facility]       Gastroenterology 2 weeks ago was negative but to be safe I sent liquid       stool eflluent for repeat C. diff testing here (for toxin and by PCR). Impression:               - The examined portion of the ileum was normal.                           - Severe inflammation from rectum to cecum. This is                             likely extensive, pan Ulcerative Colitis. In the                            left colon there was a slight psuedomembranous look                            to the severe colitis. This is probably just UC                            associated mucopurelence. C. diff testing by his                            [facility] Gastroenterology 2 weeks ago was                            negative but to be safe I sent liquid stool                            eflluent for repeat C. diff testing here (for toxin                            and by PCR). Moderate Sedation:      Moderate (conscious) sedation was administered by the endoscopy nurse       and supervised by the endoscopist. The following parameters were       monitored: oxygen saturation, heart rate, blood pressure, and response       to care. Total physician intraservice time was 15 minutes. Recommendation:           - Return patient to hospital ward for possible  discharge same day.                           - Advance diet as tolerated.                           - Will start prednisone 63m twice daily and also                            mesalamine (lialda) 4 pills once daily.                           - At discharge he should be given zofran 468mpills,                            take one pill twice daily for the next 3-4 days as                            well.                           - If stool testing from this test shows C.                            difficile, then will add vancomycin 12579mID for 2                            weeks.                           - Await pathology results. [facility]                            happy to see him in the office. My office will                            contact him with follow up in 3-4 weeks with myself                            or extender. Procedure Code(s):        ---  Professional ---                           [id number], flexible; with biopsy, single                            or multiple Diagnosis Code(s):        --- Professional ---                           K52.9, Noninfective gastroenteritis and colitis,  unspecified                           K62.5, Hemorrhage of anus and rectum CPT copyright 2016 American Medical Association. All rights reserved. The codes documented in this report are preliminary and upon coder review may  be revised to meet current compliance requirements. [name], MD 05/24/2016 8:52:25 AM This report has been signed electronically. Number of Addenda: 0

## 2016-05-24 NOTE — Op Note (Signed)
[facility] Patient Name: [name] Procedure Date: 05/24/2016 MRN: [id number] Attending MD: [name] , MD Date of Birth: 08-10-1985 CSN: [id number] Age: 30 Admit Type: Inpatient Procedure:                Upper GI endoscopy Indications:              Nausea with vomiting [facility] gastroenterologist                            was planning EGD this week) Providers:                [name], MD, Cleda Daub, RN, [facility], Technician Referring MD:              Medicines:                Fentanyl 50 micrograms IV, Midazolam 4 mg IV Complications:            No immediate complications. Estimated blood loss:                            None. Estimated Blood Loss:     Estimated blood loss: none. Procedure:                Pre-Anesthesia Assessment:                           - Prior to the procedure, a History and Physical                            was performed, and patient medications and                            allergies were reviewed. The patient's tolerance of                            previous anesthesia was also reviewed. The risks                            and benefits of the procedure and the sedation                            options and risks were discussed with the patient.                            All questions were answered, and informed consent                            was obtained. Prior Anticoagulants: The patient has                            taken no previous anticoagulant or antiplatelet  agents. ASA Grade Assessment: II - A patient with                            mild systemic disease. After reviewing the risks                            and benefits, the patient was deemed in                            satisfactory condition to undergo the procedure.                           After obtaining informed consent, the endoscope was                            passed  under direct vision. Throughout the                            procedure, the patient's blood pressure, pulse, and                            oxygen saturations were monitored continuously. The                            was introduced through the mouth, and advanced to                            the second part of duodenum. The upper GI endoscopy                            was accomplished without difficulty. The patient                            tolerated the procedure well. Scope In: Scope Out: Findings:      The esophagus was normal.      The stomach was normal.      The examined duodenum was normal. Impression:               - Normal esophagus.                           - Normal stomach.                           - Normal examined duodenum.                           - No specimens collected. Moderate Sedation:      Moderate (conscious) sedation was administered by the endoscopy nurse       and supervised by the endoscopist. The following parameters were       monitored: oxygen saturation, heart rate, blood pressure, and response       to care. Total physician intraservice time was 5 minutes. Recommendation:           - Return patient to hospital ward for possible  discharge same day.                           - Advance diet as tolerated.                           - Continue present medications.                           - See colonoscopy report from final                            recommendations, plan. Procedure Code(s):        --- Professional ---                           [id number], Esophagogastroduodenoscopy, flexible,                            transoral; diagnostic, including collection of                            specimen(s) by brushing or washing, when performed                            (separate procedure) Diagnosis Code(s):        --- Professional ---                           R11.2, Nausea with vomiting, unspecified CPT copyright 2016  American Medical Association. All rights reserved. The codes documented in this report are preliminary and upon coder review may  be revised to meet current compliance requirements. [name], MD 05/24/2016 8:55:45 AM This report has been signed electronically. Number of Addenda: 0

## 2016-05-24 NOTE — Discharge Instructions (Signed)
[name] was admitted to the Hospital on 05/22/2016 and Discharged on Discharge Date 05/24/2016 and should be excused from work/school   for  3   days starting 05/22/2016 , may return to work/school without any restrictions.  Call Bess Harvest MD, [facility] [phone] with questions.  Charlynne Cousins M.D on 05/24/2016,at 1:48 PM  [facility] Office  [phone]

## 2016-05-24 NOTE — Telephone Encounter (Signed)
-----   Message from [name], MD sent at 05/24/2016  9:11 AM EST ----- He is in hospital. Wants to change from [facility] to me.   He needs rov with me or extender in 3-4 weeks for hospital follow up, new diagnosis of extensive pan UC.  thanks

## 2016-05-27 ENCOUNTER — Encounter (HOSPITAL_COMMUNITY): Payer: Self-pay | Admitting: Gastroenterology

## 2016-05-30 ENCOUNTER — Other Ambulatory Visit: Payer: Self-pay

## 2016-05-30 MED ORDER — PREDNISONE 20 MG PO TABS: 40.0000 mg | ORAL_TABLET | Freq: Every day | ORAL | 0 refills | Status: DC

## 2016-05-30 NOTE — Telephone Encounter (Signed)
Pt has been notified of the results and will keep appt as scheduled.  Pt did not have enough prednisone to last until appt.  Refill sent

## 2016-06-21 ENCOUNTER — Telehealth: Payer: Self-pay | Admitting: *Deleted

## 2016-06-21 ENCOUNTER — Other Ambulatory Visit (INDEPENDENT_AMBULATORY_CARE_PROVIDER_SITE_OTHER): Payer: 59

## 2016-06-21 ENCOUNTER — Other Ambulatory Visit: Payer: Self-pay

## 2016-06-21 ENCOUNTER — Ambulatory Visit (INDEPENDENT_AMBULATORY_CARE_PROVIDER_SITE_OTHER): Payer: 59 | Admitting: Physician Assistant

## 2016-06-21 ENCOUNTER — Encounter: Payer: Self-pay | Admitting: Physician Assistant

## 2016-06-21 VITALS — BP 120/78 | HR 82 | Ht 75.0 in | Wt 310.5 lb

## 2016-06-21 DIAGNOSIS — K5100 Ulcerative (chronic) pancolitis without complications: Secondary | ICD-10-CM

## 2016-06-21 DIAGNOSIS — K51918 Ulcerative colitis, unspecified with other complication: Secondary | ICD-10-CM

## 2016-06-21 DIAGNOSIS — R748 Abnormal levels of other serum enzymes: Secondary | ICD-10-CM

## 2016-06-21 LAB — CBC WITH DIFFERENTIAL/PLATELET
BASOS ABS: 0 10*3/uL (ref 0.0–0.1)
Basophils Relative: 0.3 % (ref 0.0–3.0)
EOS ABS: 0.1 10*3/uL (ref 0.0–0.7)
Eosinophils Relative: 0.9 % (ref 0.0–5.0)
HCT: 40.3 % (ref 39.0–52.0)
Hemoglobin: 13.1 g/dL (ref 13.0–17.0)
LYMPHS ABS: 4.8 10*3/uL — AB (ref 0.7–4.0)
Lymphocytes Relative: 43.8 % (ref 12.0–46.0)
MCHC: 32.6 g/dL (ref 30.0–36.0)
MCV: 87.6 fl (ref 78.0–100.0)
MONO ABS: 0.6 10*3/uL (ref 0.1–1.0)
MONOS PCT: 5.7 % (ref 3.0–12.0)
NEUTROS ABS: 5.4 10*3/uL (ref 1.4–7.7)
Neutrophils Relative %: 49.3 % (ref 43.0–77.0)
Platelets: 350 10*3/uL (ref 150.0–400.0)
RBC: 4.6 Mil/uL (ref 4.22–5.81)
RDW: 16.3 % — ABNORMAL HIGH (ref 11.5–15.5)
WBC: 11 10*3/uL — ABNORMAL HIGH (ref 4.0–10.5)

## 2016-06-21 LAB — COMPREHENSIVE METABOLIC PANEL
ALBUMIN: 4.1 g/dL (ref 3.5–5.2)
ALT: 70 U/L — ABNORMAL HIGH (ref 0–53)
AST: 19 U/L (ref 0–37)
Alkaline Phosphatase: 43 U/L (ref 39–117)
BUN: 18 mg/dL (ref 6–23)
CALCIUM: 9.3 mg/dL (ref 8.4–10.5)
CO2: 29 mEq/L (ref 19–32)
CREATININE: 0.95 mg/dL (ref 0.40–1.50)
Chloride: 103 mEq/L (ref 96–112)
GFR: 98.02 mL/min (ref >60.00)
Glucose, Bld: 113 mg/dL — ABNORMAL HIGH (ref 70–99)
POTASSIUM: 3.7 meq/L (ref 3.5–5.1)
Sodium: 140 mEq/L (ref 135–145)
Total Bilirubin: 0.3 mg/dL (ref 0.2–1.2)
Total Protein: 6.8 g/dL (ref 6.0–8.3)

## 2016-06-21 MED ORDER — PREDNISONE 5 MG PO TABS: ORAL_TABLET | ORAL | 0 refills | Status: DC

## 2016-06-21 MED ORDER — MESALAMINE 1.2 G PO TBEC: DELAYED_RELEASE_TABLET | ORAL | 3 refills | Status: DC

## 2016-06-21 NOTE — Patient Instructions (Addendum)
Please go to the basement level to have your labs drawn.  We sent prescriptions to [place].  1.Lialda 2. Prednisone 5 mg.   Appointment with Dr. Ardis Hughs in 2-3 months.

## 2016-06-21 NOTE — Progress Notes (Signed)
Chief Complaint: Follow up Ulcerative Colitis  HPI:    [name] is a 30 year old Caucasian male with a past medical history of anxiety and hypertension as well as a new diagnosis of ulcerative colitis, who presents to clinic today for follow-up of his ulcerative colitis. Please recall patient was seen in the hospital from 05/22/16 through 05/24/16, at that time he had a colonoscopy and EGD with Dr. Ardis Hughs which showed normal ileum, severe inflammation from rectum to cecum which was likely pan ulcerative colitis as well as slight pseudomembranous look to the left colon, which was thought to be UC associated mucopurulence. Patient was started on Prednisone 20 mg twice a day and Lialda 4 pills daily. He also had retesting for C. difficile which was negative. Pathology revealed chronic active ulcerative colitis throughout the entire colon. The patient had an EGD which was normal.   Today, the patient tells me that he has continued on his Prednisone 40 mg daily as well as his Lialda 4.8 g per day and all of his symptoms have resolved. He is currently having one to 2 soft formed bowel movements per day and describes only very occasional burning abdominal pain which will last 15-20 minutes in the middle of his abdomen. He also tells me he has gained at least 7 pounds since being started on medicine. He denies any further symptoms of nausea, vomiting, heartburn, reflux or hematochezia. Overall he feels very well and is very happy with his improvement.   Patient denies fever, chills, melena, weight loss, fatigue, anorexia, heartburn, reflux or symptoms that awaken him at night.  Past Medical History:  Diagnosis Date  . Anxiety    panic disorder  . Hypertension     Past Surgical History:  Procedure Laterality Date  . COLONOSCOPY N/A 05/24/2016   Procedure: COLONOSCOPY;  Surgeon: [name], MD;  Location: [facility];  Service: Endoscopy;  Laterality: N/A;  . ESOPHAGOGASTRODUODENOSCOPY N/A  05/24/2016   Procedure: ESOPHAGOGASTRODUODENOSCOPY (EGD);  Surgeon: [name], MD;  Location: [facility];  Service: Endoscopy;  Laterality: N/A;  . LUMBAR LAMINECTOMY/DECOMPRESSION MICRODISCECTOMY N/A 02/14/2016   Procedure: MICRO  LUMBER L4-5;  Surgeon: Susa Day, MD;  Location: [facility];  Service: Orthopedics;  Laterality: N/A;  . NO PAST SURGERIES      Current Outpatient Prescriptions  Medication Sig Dispense Refill  . escitalopram (LEXAPRO) 10 MG tablet Take 10 mg by mouth daily.   1  . lisinopril (PRINIVIL,ZESTRIL) 20 MG tablet Take 20 mg by mouth daily.   1  . mesalamine (LIALDA) 1.2 g EC tablet Take 4 tablets (4.8 g total) by mouth daily with breakfast. 30 tablet 3  . promethazine (PHENERGAN) 12.5 MG tablet Take 2 tablets (25 mg total) by mouth every 6 (six) hours as needed. 30 tablet 0   No current facility-administered medications for this visit.     Allergies as of 06/21/2016 - Review Complete 06/21/2016  Allergen Reaction Noted  . Penicillins Hives 02/06/2016  . Sulfa antibiotics Other (See Comments) 02/06/2016    Family History  Problem Relation Age of Onset  . Hypertension Mother   . Hypertension Father   . Diabetes Father   . Inflammatory bowel disease Other   . Diabetes Other   . Cancer Neg Hx     Social History   Social History  . Marital status: Married    Spouse name: N/A  . Number of children: 0  . Years of education: N/A   Occupational History  .  Not on file.   Social History Main Topics  . Smoking status: Never Smoker  . Smokeless tobacco: Never Used  . Alcohol use Yes     Comment: occ  . Drug use: No  . Sexual activity: Not on file   Other Topics Concern  . Not on file   Social History Narrative  . No narrative on file    Review of Systems:     Constitutional: Positive for a 7 pound weight gain over the past month No weight loss, fever, chills, weakness or fatigue Cardiovascular: No chest pain Respiratory: No SOB    Gastrointestinal: See HPI and otherwise negative   Physical Exam:  Vital signs: BP 120/78   Pulse 82   Ht 6' 3"  (1.905 m)   Wt (!) 310 lb 8 oz (140.8 kg)   BMI 38.81 kg/m   Constitutional:   Pleasant Overweight Caucasian male appears to be in NAD, Well developed, Well nourished, alert and cooperative Respiratory: Respirations even and unlabored. Lungs clear to auscultation bilaterally.   No wheezes, crackles, or rhonchi.  Cardiovascular: Normal S1, S2. No MRG. Regular rate and rhythm. No peripheral edema, cyanosis or pallor.  Gastrointestinal:  Soft, nondistended, nontender. No rebound or guarding. Normal bowel sounds. No appreciable masses or hepatomegaly. Rectal:  Not performed.  Psychiatric: Demonstrates good judgement and reason without abnormal affect or behaviors.  MOST RECENT LABS: CBC    Component Value Date/Time   WBC 9.7 05/23/2016 1142   RBC 3.86 (L) 05/23/2016 1142   HGB 11.0 (L) 05/23/2016 1142   HCT 32.9 (L) 05/23/2016 1142   PLT 567 (H) 05/23/2016 1142   MCV 85.2 05/23/2016 1142   MCH 28.5 05/23/2016 1142   MCHC 33.4 05/23/2016 1142   RDW 12.9 05/23/2016 1142    CMP     Component Value Date/Time   NA 136 05/23/2016 0314   K 4.2 05/23/2016 0314   CL 105 05/23/2016 0314   CO2 24 05/23/2016 0314   GLUCOSE 107 (H) 05/23/2016 0314   BUN 8 05/23/2016 0314   CREATININE 0.94 05/23/2016 0314   CALCIUM 8.2 (L) 05/23/2016 0314   PROT 5.9 (L) 05/23/2016 0314   ALBUMIN 3.0 (L) 05/23/2016 0314   AST 18 05/23/2016 0314   ALT 27 05/23/2016 0314   ALKPHOS 43 05/23/2016 0314   BILITOT 0.8 05/23/2016 0314   GFRNONAA >60 05/23/2016 0314   GFRAA >60 05/23/2016 0314    Assessment: 1. Ulcerative colitis: Patient doing well on Lialda 4.8 g per day and Prednisone 40 mg per day  Plan: 1. Ordered CBC and CMP today 2. Patient to continue Lialda 4.8 g per day, refilled this today for 90 days 3. Recommend the patient start a taper off his Prednisone by 5 mg per week.  Prescription was sent in today. He will start tapering on Sunday, 2 days from now and this will last for 8 weeks, he is to call our clinic with any change/increase in his symptoms 4. Patient to follow in clinic with Dr. Ardis Hughs in 2-3 months or sooner if necessary.  Ellouise Newer, PA-C [facility] 06/21/2016, 8:41 AM  Cc: Jefm Petty, MD

## 2016-06-21 NOTE — Telephone Encounter (Signed)
I called the patient to advise we made him a follow up appointment with Dr. Ardis Hughs for 09-05-2007 at 9:00 am.

## 2016-06-24 NOTE — Progress Notes (Signed)
I agree with the above note, plan 

## 2016-07-04 ENCOUNTER — Other Ambulatory Visit: Payer: Self-pay | Admitting: Physician Assistant

## 2016-07-04 MED ORDER — PREDNISONE 5 MG PO TABS: ORAL_TABLET | ORAL | 0 refills | Status: DC

## 2016-07-09 ENCOUNTER — Other Ambulatory Visit: Payer: Self-pay

## 2016-07-09 ENCOUNTER — Other Ambulatory Visit: Payer: Self-pay | Admitting: Physician Assistant

## 2016-07-09 MED ORDER — PREDNISONE 5 MG PO TABS: ORAL_TABLET | ORAL | 0 refills | Status: DC

## 2016-07-09 NOTE — Telephone Encounter (Signed)
Do you want to refill Prednisone?

## 2016-07-09 NOTE — Telephone Encounter (Signed)
Caryl Pina can you please forward to Dr. Ardis Hughs, I think this is his patient. If he's out this week and I am covering, if the patient is feeling well and stable on Liada, can you clarify how much prednisone he is taking? This is not a long term medication for colitis.  Thanks for any clarification.

## 2016-07-10 NOTE — Telephone Encounter (Signed)
Caryl Pina can you please clarify this issue as outlined in prior message and contact this patient. This message got sent back to me but need some clarification. I will be out of the office the next 2 days. If he is feeling well he should not be on any prednisone, not sure what dose if any he is taking at this time. If he really needs it please ask doc of day to clarify dosing. Thanks

## 2016-07-11 NOTE — Telephone Encounter (Signed)
Left message for pt to return call.

## 2016-07-12 ENCOUNTER — Other Ambulatory Visit: Payer: Self-pay | Admitting: *Deleted

## 2016-07-12 ENCOUNTER — Other Ambulatory Visit (INDEPENDENT_AMBULATORY_CARE_PROVIDER_SITE_OTHER): Payer: 59

## 2016-07-12 DIAGNOSIS — R748 Abnormal levels of other serum enzymes: Secondary | ICD-10-CM | POA: Diagnosis not present

## 2016-07-12 DIAGNOSIS — K5100 Ulcerative (chronic) pancolitis without complications: Secondary | ICD-10-CM

## 2016-07-12 LAB — CBC WITH DIFFERENTIAL/PLATELET
BASOS ABS: 0.1 10*3/uL (ref 0.0–0.1)
Basophils Relative: 0.4 % (ref 0.0–3.0)
EOS ABS: 0.1 10*3/uL (ref 0.0–0.7)
EOS PCT: 0.8 % (ref 0.0–5.0)
HCT: 41.7 % (ref 39.0–52.0)
Hemoglobin: 14.4 g/dL (ref 13.0–17.0)
LYMPHS ABS: 5.1 10*3/uL — AB (ref 0.7–4.0)
Lymphocytes Relative: 39 % (ref 12.0–46.0)
MCHC: 34.5 g/dL (ref 30.0–36.0)
MCV: 85.1 fl (ref 78.0–100.0)
MONO ABS: 0.6 10*3/uL (ref 0.1–1.0)
Monocytes Relative: 4.6 % (ref 3.0–12.0)
NEUTROS PCT: 55.2 % (ref 43.0–77.0)
Neutro Abs: 7.2 10*3/uL (ref 1.4–7.7)
Platelets: 346 10*3/uL (ref 150.0–400.0)
RBC: 4.89 Mil/uL (ref 4.22–5.81)
RDW: 14.5 % (ref 11.5–15.5)
WBC: 13 10*3/uL — ABNORMAL HIGH (ref 4.0–10.5)

## 2016-07-12 LAB — COMPREHENSIVE METABOLIC PANEL
ALK PHOS: 40 U/L (ref 39–117)
ALT: 58 U/L — ABNORMAL HIGH (ref 0–53)
AST: 22 U/L (ref 0–37)
Albumin: 4.4 g/dL (ref 3.5–5.2)
BUN: 17 mg/dL (ref 6–23)
CO2: 27 mEq/L (ref 19–32)
Calcium: 9.4 mg/dL (ref 8.4–10.5)
Chloride: 104 mEq/L (ref 96–112)
Creatinine, Ser: 0.97 mg/dL (ref 0.40–1.50)
GFR: 95.66 mL/min (ref >60.00)
GLUCOSE: 98 mg/dL (ref 70–99)
POTASSIUM: 3.7 meq/L (ref 3.5–5.1)
SODIUM: 140 meq/L (ref 135–145)
TOTAL PROTEIN: 6.9 g/dL (ref 6.0–8.3)
Total Bilirubin: 0.3 mg/dL (ref 0.2–1.2)

## 2016-07-17 NOTE — Telephone Encounter (Signed)
Spoke to patient, he is still tapering off his dosage of prednisone. He states that this refill was taken care of by Ellouise Newer, Pa. He is currently on 20 mg daily, tapering off by 5 mg weekly. He states doing fine.

## 2016-07-17 NOTE — Telephone Encounter (Signed)
Okay thanks for the follow up. Further medication requests should come to Dr. Ardis Hughs, I think I was covering him the day this patient called in. No further prednisone needed if he has enough to complete his taper. Thanks

## 2016-09-04 ENCOUNTER — Encounter: Payer: Self-pay | Admitting: Gastroenterology

## 2016-09-04 ENCOUNTER — Ambulatory Visit (INDEPENDENT_AMBULATORY_CARE_PROVIDER_SITE_OTHER): Payer: 59 | Admitting: Gastroenterology

## 2016-09-04 VITALS — BP 132/86 | HR 80 | Ht 75.0 in | Wt 333.6 lb

## 2016-09-04 DIAGNOSIS — K5100 Ulcerative (chronic) pancolitis without complications: Secondary | ICD-10-CM | POA: Diagnosis not present

## 2016-09-04 NOTE — Patient Instructions (Signed)
Continue lialda 4 pills once per day. Please return to see Dr. Ardis Hughs in 4 months, sooner if problems.

## 2016-09-04 NOTE — Progress Notes (Signed)
Review of pertinent gastrointestinal problems: 1. Extensive ulcerative colitis: diagnosed when admitted with GI bleeding11/8/17 through 05/24/16, colonoscopy showed normal ileum, severe inflammation from rectum to cecum Pathology revealed chronic active ulcerative colitis throughout the entire colon. Patient was started on Prednisone 20 mg twice a day and Lialda 4 pills daily. He responded very well, tapered easily off the prednisone.    HPI: This is a very pleasant 31 year old man recently diagnosed with extensive ulcerative colitis  Has  Been completely off prendisone for 2-3 weeks.  No bleeding while tapering but perhaps a bit more urgency.  Intermittently some urgency after dinner, pretty uncommon.  Solid BM, brown once daily.  No abd pains.    Has gained weight back (lost 25 during acute illness, gained most of it back).  No eye symptoms, no rashes.  Chief complaint is  ulcerative colitis  ROS: complete GI ROS as described in HPI.  Constitutional:  No unintentional weight loss   Past Medical History:  Diagnosis Date  . Anxiety    panic disorder  . Hypertension     Past Surgical History:  Procedure Laterality Date  . COLONOSCOPY N/A 05/24/2016   Procedure: COLONOSCOPY;  Surgeon: Jeffrey Banister, MD;  Location: [facility];  Service: Endoscopy;  Laterality: N/A;  . ESOPHAGOGASTRODUODENOSCOPY N/A 05/24/2016   Procedure: ESOPHAGOGASTRODUODENOSCOPY (EGD);  Surgeon: Jeffrey Banister, MD;  Location: [facility];  Service: Endoscopy;  Laterality: N/A;  . LUMBAR LAMINECTOMY/DECOMPRESSION MICRODISCECTOMY N/A 02/14/2016   Procedure: MICRO  LUMBER L4-5;  Surgeon: Susa Day, MD;  Location: [facility];  Service: Orthopedics;  Laterality: N/A;  . NO PAST SURGERIES      Current Outpatient Prescriptions  Medication Sig Dispense Refill  . escitalopram (LEXAPRO) 10 MG tablet Take 10 mg by mouth daily.   1  . lisinopril (PRINIVIL,ZESTRIL) 20 MG tablet Take 20 mg by mouth daily.   1  .  mesalamine (LIALDA) 1.2 g EC tablet Take 4 tablets daily. 360 tablet 3   No current facility-administered medications for this visit.     Allergies as of 09/04/2016 - Review Complete 09/04/2016  Allergen Reaction Noted  . Penicillins Hives 02/06/2016  . Sulfa antibiotics Other (See Comments) 02/06/2016    Family History  Problem Relation Age of Onset  . Hypertension Mother   . Hypertension Father   . Diabetes Father   . Inflammatory bowel disease Other   . Diabetes Other   . Cancer Neg Hx     Social History   Social History  . Marital status: Married    Spouse name: N/A  . Number of children: 0  . Years of education: N/A   Occupational History  . Not on file.   Social History Main Topics  . Smoking status: Never Smoker  . Smokeless tobacco: Never Used  . Alcohol use Yes     Comment: occ  . Drug use: No  . Sexual activity: Not on file   Other Topics Concern  . Not on file   Social History Narrative  . No narrative on file     Physical Exam: BP 132/86   Pulse 80   Ht 6' 3"  (1.905 m)   Wt (!) 333 lb 9.6 oz (151.3 kg)   BMI 41.70 kg/m  Constitutional: generally well-appearing Psychiatric: alert and oriented x3 Abdomen: soft, nontender, nondistended, no obvious ascites, no peritoneal signs, normal bowel sounds No peripheral edema noted in lower extremities  Assessment and plan: 31 y.o. male witextensive ulcerative colitis  he is  doing very well currently on mesalamine full strength. He will continue on this regimen. We discussed cigarette smoking, we discussed avoidance of NSAIDs. He is going to return to see me in 4 months and sooner if he has any concerns.  Please see the "Patient Instructions" section for addition details about the plan.  [name], MD [facility] 09/04/2016, 9:04 AM

## 2016-12-11 ENCOUNTER — Encounter: Payer: Self-pay | Admitting: Physician Assistant

## 2016-12-11 NOTE — Progress Notes (Signed)
[name] is a 31 y.o. male here to Establish Care and needs refills on blood pressure and anxiety medication.   I acted as a Education administrator for [employer], PA-C Anselmo Pickler, LPN  History of Present Illness:   Chief Complaint  Patient presents with  . Establish Care    [employer]  . Medication Refill    Lisinopril and Lexapro    Acute Concerns: None  Chronic Issues: HTN -- diagnosed 4 years ago, was initially on Lisinopril/HTCZ but now on Lisinopril 20 mg, checks blood pressures at home and gets 120s/70-80s on the bottom, no leg swelling, blurred vision, chest pain Ulcerative colitis -- diagnosed last October, controlled on Mesalamine, took off prednisone around the first of the year and has been doing well since, managed by Dr. Ardis Hughs Anxiety -- initially presented with "heart pounding" when in crowds, did a heart monitor to r/o any issues and was negative, was dx with agoraphobia, started lexapro and has been well controlled since, was trialed on Effexor and Cymbalta but did not work as well as lexapro Obesity -- patient is currently at his highest weight ever, he is interested in weight loss  Health Maintenance: Immunizations -- up to date Colonoscopy -- last performed on 06/03/16 Weight -- Weight: (!) 332 lb 4 oz (150.7 kg) -- highest he has ever been  Depression screen PHQ 2/9 12/12/2016  Decreased Interest 0  Down, Depressed, Hopeless 0  PHQ - 2 Score 0   GAD 7 : Generalized Anxiety Score 12/12/2016  Nervous, Anxious, on Edge 1  Control/stop worrying 1  Worry too much - different things 1  Trouble relaxing 1  Restless 1  Easily annoyed or irritable 0  Afraid - awful might happen 0  Total GAD 7 Score 5  Anxiety Difficulty Not difficult at all    Other providers/specialists: Dr. Ardis Hughs or his PA ---  Sees every few months Dr. Susa Day -- back surgery in 2017 (two herniated discs and lower lumbar laminectomy)  PMHx, SurgHx, SocialHx, Medications, and  Allergies were reviewed in the Visit Navigator and updated as appropriate.  Current Medications:   Current Outpatient Prescriptions:  .  escitalopram (LEXAPRO) 10 MG tablet, Take 1 tablet (10 mg total) by mouth daily., Disp: 90 tablet, Rfl: 1 .  lisinopril (PRINIVIL,ZESTRIL) 20 MG tablet, Take 1 tablet (20 mg total) by mouth daily., Disp: 90 tablet, Rfl: 1 .  mesalamine (LIALDA) 1.2 g EC tablet, Take 4 tablets daily., Disp: 360 tablet, Rfl: 3   Review of Systems:   Review of Systems  Constitutional: Negative for chills, fever, malaise/fatigue and weight loss.  HENT: Negative for hearing loss, sinus pain and sore throat.   Eyes: Negative for blurred vision.  Respiratory: Negative for cough and shortness of breath.   Cardiovascular: Negative for chest pain, palpitations and leg swelling.  Gastrointestinal: Negative for abdominal pain, constipation, diarrhea, heartburn, nausea and vomiting.  Genitourinary: Negative for dysuria, frequency and urgency.  Musculoskeletal: Negative for back pain, myalgias and neck pain.  Skin: Negative for itching and rash.  Neurological: Negative for dizziness, tingling, seizures, loss of consciousness and headaches.  Endo/Heme/Allergies: Negative for polydipsia.  Psychiatric/Behavioral: Negative for depression. The patient is nervous/anxious.        Has anxiety issues x 3 years, lexapro is working well,except for weight gain from medication.    Vitals:   Vitals:   12/12/16 0823 12/12/16 0854  BP: (!) 160/90 (!) 146/90  Pulse: (!) 103   Temp: 98.7 F (37.1  C)   TempSrc: Oral   SpO2: 97%   Weight: (!) 332 lb 4 oz (150.7 kg)   Height: 6' 2.5" (1.892 m)      Body mass index is 42.09 kg/m.  Physical Exam:   Physical Exam  Constitutional: He appears well-developed. He is cooperative.  Non-toxic appearance. He does not have a sickly appearance. He does not appear ill. No distress.  Cardiovascular: Normal rate, regular rhythm, S1 normal, S2 normal,  normal heart sounds and normal pulses.   No LE edema  Pulmonary/Chest: Effort normal and breath sounds normal.  Neurological: He is alert.  Nursing note and vitals reviewed.     Assessment and Plan:    [name] was seen today for establish care and medication refill.  Diagnoses and all orders for this visit:  Hypertension, unspecified type At home blood pressure readings are well-controlled. Continue Lisinopril 20 mg, continue blood pressure checks at home, and follow-up with [place] in 6 months.  Anxiety GAD-7 is 5 today, well controlled on Lexapro. Continue current medication management. Follow-up in 6 months.  Morbid obesity ([place]) Patient is agreeable to discussion about weight loss at future visit, I will have him follow-up with [place] in about 1 month to discuss weight loss.  Other orders -     lisinopril (PRINIVIL,ZESTRIL) 20 MG tablet; Take 1 tablet (20 mg total) by mouth daily. -     escitalopram (LEXAPRO) 10 MG tablet; Take 1 tablet (10 mg total) by mouth daily.    . Reviewed expectations re: course of current medical issues. . Discussed self-management of symptoms. . Outlined signs and symptoms indicating need for more acute intervention. . Patient verbalized understanding and all questions were answered. . See orders for this visit as documented in the electronic medical record. . Patient received an After-Visit Summary.  CMA or LPN served as scribe during this visit. History, Physical, and Plan performed by medical provider. Documentation and orders reviewed and attested to.  Inda Coke, PA-C

## 2016-12-12 ENCOUNTER — Encounter: Payer: Self-pay | Admitting: Physician Assistant

## 2016-12-12 ENCOUNTER — Ambulatory Visit (INDEPENDENT_AMBULATORY_CARE_PROVIDER_SITE_OTHER): Payer: 59 | Admitting: Physician Assistant

## 2016-12-12 DIAGNOSIS — F419 Anxiety disorder, unspecified: Secondary | ICD-10-CM

## 2016-12-12 DIAGNOSIS — I10 Essential (primary) hypertension: Secondary | ICD-10-CM | POA: Diagnosis not present

## 2016-12-12 MED ORDER — LISINOPRIL 20 MG PO TABS: 20.0000 mg | ORAL_TABLET | Freq: Every day | ORAL | 1 refills | Status: DC

## 2016-12-12 MED ORDER — ESCITALOPRAM OXALATE 10 MG PO TABS: 10.0000 mg | ORAL_TABLET | Freq: Every day | ORAL | 1 refills | Status: DC

## 2016-12-12 NOTE — Patient Instructions (Addendum)
It was great meeting you!!  I have refilled your medications for 6 months, let's follow-up in 6 months to discuss your high blood pressure and anxiety -- we can do a physical at this time.  Please return to discuss weight loss.

## 2017-01-02 ENCOUNTER — Telehealth: Payer: Self-pay | Admitting: Physician Assistant

## 2017-01-02 NOTE — Telephone Encounter (Signed)
ROI fax to [place] @ [facility]

## 2017-01-03 ENCOUNTER — Other Ambulatory Visit (INDEPENDENT_AMBULATORY_CARE_PROVIDER_SITE_OTHER): Payer: 59

## 2017-01-03 ENCOUNTER — Encounter: Payer: Self-pay | Admitting: Gastroenterology

## 2017-01-03 ENCOUNTER — Ambulatory Visit (INDEPENDENT_AMBULATORY_CARE_PROVIDER_SITE_OTHER): Payer: 59 | Admitting: Gastroenterology

## 2017-01-03 VITALS — BP 130/80 | HR 84 | Ht 74.0 in | Wt 337.2 lb

## 2017-01-03 DIAGNOSIS — K5100 Ulcerative (chronic) pancolitis without complications: Secondary | ICD-10-CM

## 2017-01-03 LAB — CBC WITH DIFFERENTIAL/PLATELET
BASOS ABS: 0 10*3/uL (ref 0.0–0.1)
BASOS PCT: 0.5 % (ref 0.0–3.0)
EOS ABS: 0.1 10*3/uL (ref 0.0–0.7)
Eosinophils Relative: 1.5 % (ref 0.0–5.0)
HCT: 43 % (ref 39.0–52.0)
HEMOGLOBIN: 14.8 g/dL (ref 13.0–17.0)
Lymphocytes Relative: 33.3 % (ref 12.0–46.0)
Lymphs Abs: 3.3 10*3/uL (ref 0.7–4.0)
MCHC: 34.3 g/dL (ref 30.0–36.0)
MCV: 83.2 fl (ref 78.0–100.0)
MONO ABS: 0.8 10*3/uL (ref 0.1–1.0)
Monocytes Relative: 8.1 % (ref 3.0–12.0)
NEUTROS ABS: 5.5 10*3/uL (ref 1.4–7.7)
Neutrophils Relative %: 56.6 % (ref 43.0–77.0)
PLATELETS: 336 10*3/uL (ref 150.0–400.0)
RBC: 5.17 Mil/uL (ref 4.22–5.81)
RDW: 12.7 % (ref 11.5–15.5)
WBC: 9.8 10*3/uL (ref 4.0–10.5)

## 2017-01-03 LAB — COMPREHENSIVE METABOLIC PANEL
ALT: 66 U/L — ABNORMAL HIGH (ref 0–53)
AST: 35 U/L (ref 0–37)
Albumin: 4.3 g/dL (ref 3.5–5.2)
Alkaline Phosphatase: 53 U/L (ref 39–117)
BUN: 12 mg/dL (ref 6–23)
CHLORIDE: 103 meq/L (ref 96–112)
CO2: 26 meq/L (ref 19–32)
CREATININE: 0.91 mg/dL (ref 0.40–1.50)
Calcium: 9.4 mg/dL (ref 8.4–10.5)
GFR: 102.66 mL/min (ref >60.00)
Glucose, Bld: 91 mg/dL (ref 70–99)
Potassium: 3.7 mEq/L (ref 3.5–5.1)
SODIUM: 138 meq/L (ref 135–145)
Total Bilirubin: 0.3 mg/dL (ref 0.2–1.2)
Total Protein: 6.9 g/dL (ref 6.0–8.3)

## 2017-01-03 NOTE — Progress Notes (Signed)
Review of pertinent gastrointestinal problems: 1. Extensive ulcerative colitis: diagnosed when admitted with GI bleeding11/8/17through 05/24/16, colonoscopy showed normal ileum, severe inflammation from rectum to cecum Pathology revealed chronic active ulcerative colitis throughout the entire colon. Patient was started on Prednisone 20 mg twice a day and Lialda4 pills daily. He responded very well, tapered easily off the prednisone.  HPI: This is a very pleasant 31 year old man whom I last saw 4 months ago.  Feels back to normal.  Has 1-2 soft to formed BMs daily, brown stools, non-bloody.  No abd pains.  No rashes  No eye problems  Chief complaint is extensive ulcerative colitis  He is very good about taking his oral mesalamine, 4 pills once daily  ROS: complete GI ROS as described in HPI, all other review negative.  Constitutional:  No unintentional weight loss   Past Medical History:  Diagnosis Date  . Anxiety    panic disorder  . Hypertension     Past Surgical History:  Procedure Laterality Date  . COLONOSCOPY N/A 05/24/2016   Procedure: COLONOSCOPY;  Surgeon: [name], MD;  Location: [facility];  Service: Endoscopy;  Laterality: N/A;  . ESOPHAGOGASTRODUODENOSCOPY N/A 05/24/2016   Procedure: ESOPHAGOGASTRODUODENOSCOPY (EGD);  Surgeon: [name], MD;  Location: [facility];  Service: Endoscopy;  Laterality: N/A;  . LUMBAR LAMINECTOMY/DECOMPRESSION MICRODISCECTOMY N/A 02/14/2016   Procedure: MICRO  LUMBER L4-5;  Surgeon: Susa Day, MD;  Location: [facility];  Service: Orthopedics;  Laterality: N/A;  . NO PAST SURGERIES      Current Outpatient Prescriptions  Medication Sig Dispense Refill  . escitalopram (LEXAPRO) 10 MG tablet Take 1 tablet (10 mg total) by mouth daily. 90 tablet 1  . lisinopril (PRINIVIL,ZESTRIL) 20 MG tablet Take 1 tablet (20 mg total) by mouth daily. 90 tablet 1  . mesalamine (LIALDA) 1.2 g EC tablet Take 4 tablets daily. 360 tablet 3    No current facility-administered medications for this visit.     Allergies as of 01/03/2017 - Review Complete 01/03/2017  Allergen Reaction Noted  . Penicillins Hives 02/06/2016  . Sulfa antibiotics Other (See Comments) 02/06/2016    Family History  Problem Relation Age of Onset  . Hypertension Mother   . Diabetes Mother   . Hyperlipidemia Mother   . Hypertension Father   . Diabetes Father   . Inflammatory bowel disease Other   . Diabetes Other   . High Cholesterol Maternal Grandmother   . Hypertension Maternal Grandmother   . Heart disease Maternal Grandfather   . High Cholesterol Maternal Grandfather   . Hypertension Maternal Grandfather   . Stroke Maternal Grandfather   . Cancer Paternal Grandmother   . Diabetes Paternal Grandmother   . Heart disease Paternal Grandmother   . High Cholesterol Paternal Grandmother   . Hypertension Paternal Grandmother   . Heart disease Paternal Grandfather   . High Cholesterol Paternal Grandfather   . Hypertension Paternal Grandfather   . Stroke Paternal Grandfather     Social History   Social History  . Marital status: Married    Spouse name: N/A  . Number of children: 0  . Years of education: N/A   Occupational History  . Not on file.   Social History Main Topics  . Smoking status: Never Smoker  . Smokeless tobacco: Never Used  . Alcohol use Yes     Comment: occ  . Drug use: No  . Sexual activity: Yes    Partners: Female   Other Topics Concern  .  Not on file   Social History Narrative   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, no children   Fun: golf     Physical Exam: BP 130/80 (BP Location: Left Arm, Patient Position: Sitting, Cuff Size: Large)   Pulse 84   Ht 6' 2"  (1.88 m) Comment: height measured without shoes  Wt (!) 337 lb 4 oz (153 kg)   BMI 43.30 kg/m  Constitutional: generally well-appearing Psychiatric: alert and oriented x3 Abdomen: soft, nontender, nondistended, no obvious ascites,  no peritoneal signs, normal bowel sounds No peripheral edema noted in lower extremities  Assessment and plan: 31 y.o. male with Extensive ulcerative colitis under very good control on oral mesalamine  He will continue on Lialda 4 pills once daily. He will return to see me in 6 months and sooner if any issues arise prior to then.  Please see the "Patient Instructions" section for addition details about the plan.  [name], MD [facility] 01/03/2017, 3:33 PM

## 2017-01-03 NOTE — Patient Instructions (Addendum)
Please return to see Dr. Ardis Hughs in 6 months, sooner if any problems.  Normal BMI (Body Mass Index- based on height and weight) is between 19 and 25. Your BMI today is Body mass index is 43.3 kg/m.Please consider follow up  regarding your BMI with your Primary Care Provider.

## 2017-01-10 NOTE — Progress Notes (Signed)
[name] is a 31 y.o. male is here to discuss: Weight Loss  I acted as a Education administrator for [employer], PA-C Anselmo Pickler, LPN  History of Present Illness:   Chief Complaint  Patient presents with  . Weight Loss    Pt is here to discuss weight loss. He is motivated to do this, has exercised a little bit and trying to watch what he is eating. Pt is presently at his highest  Weight 333.8 lbs. Lowest weight 3 years ago was 250 lbs. He would like to get back to 250 lbs but for now if he could get under 300 lbs to start.   His current weight loss goal is to get <300 lb.    In regards to his colitis, he has worsening symptoms if he eats excessive: leafy greens, fried foods, onions -- so he limits these foods as able.  Dietary recall: 8am: Atkins shake or pre-packaged 2 eggs mix, water 12pm: salad, grilled chicken on white bun, burger, fries, water or diet soda 6pm: eating more at home, grilled pork chops/steak/chicken, casserole, corn or sweet potato, diet pepsi No sweets or late night snacks 1-2 drinks per week of alcohol max  Golfs every Saturday. When he is golfing, he doesn't feel like he is getting his heart rate up. Does have access to a gym but doesn't go.  Sleeps well. Does feel drained at the end of the day. Goes to bed 10am-5:30pm.   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   Health Maintenance Due  Topic Date Due  . HIV Screening  01/25/2000    Past Medical History:  Diagnosis Date  . Anxiety    panic disorder  . Hypertension      Social History   Social History  . Marital status: Married    Spouse name: N/A  . Number of children: 0  . Years of education: N/A   Occupational History  . Not on file.   Social History Main Topics  . Smoking status: Never Smoker  . Smokeless tobacco: Never Used  . Alcohol use Yes     Comment: occ  . Drug use: No  . Sexual activity: Yes    Partners:  Female   Other Topics Concern  . Not on file   Social History Narrative   Accounting Department for [employer]   Married, no children   Fun: golf    Past Surgical History:  Procedure Laterality Date  . COLONOSCOPY N/A 05/24/2016   Procedure: COLONOSCOPY;  Surgeon: [name], MD;  Location: [facility];  Service: Endoscopy;  Laterality: N/A;  . ESOPHAGOGASTRODUODENOSCOPY N/A 05/24/2016   Procedure: ESOPHAGOGASTRODUODENOSCOPY (EGD);  Surgeon: [name], MD;  Location: [facility];  Service: Endoscopy;  Laterality: N/A;  . LUMBAR LAMINECTOMY/DECOMPRESSION MICRODISCECTOMY N/A 02/14/2016   Procedure: MICRO  LUMBER L4-5;  Surgeon: Susa Day, MD;  Location: [facility];  Service: Orthopedics;  Laterality: N/A;  . NO PAST SURGERIES      Family History  Problem Relation Age of Onset  . Hypertension Mother   . Diabetes Mother   . Hyperlipidemia Mother   . Hypertension Father   . Diabetes Father   . Inflammatory bowel disease Other   . Diabetes Other   . High Cholesterol Maternal Grandmother   . Hypertension Maternal Grandmother   . Heart disease Maternal Grandfather   . High Cholesterol Maternal Grandfather   .  Hypertension Maternal Grandfather   . Stroke Maternal Grandfather   . Cancer Paternal Grandmother   . Diabetes Paternal Grandmother   . Heart disease Paternal Grandmother   . High Cholesterol Paternal Grandmother   . Hypertension Paternal Grandmother   . Heart disease Paternal Grandfather   . High Cholesterol Paternal Grandfather   . Hypertension Paternal Grandfather   . Stroke Paternal Grandfather     PMHx, SurgHx, SocialHx, FamHx, Medications, and Allergies were reviewed in the Visit Navigator and updated as appropriate.   Patient Active Problem List   Diagnosis Date Noted  . Hypertension 12/12/2016  . Anxiety 12/12/2016  . Morbid obesity ([place]) 12/12/2016  . Ulcerative pancolitis without complication ([place])   . Gastrointestinal hemorrhage 05/22/2016    . HNP (herniated nucleus pulposus), lumbar 02/14/2016  . Spinal stenosis of lumbar region 02/14/2016    Social History  Substance Use Topics  . Smoking status: Never Smoker  . Smokeless tobacco: Never Used  . Alcohol use Yes     Comment: occ    Current Medications and Allergies:    Current Outpatient Prescriptions:  .  escitalopram (LEXAPRO) 10 MG tablet, Take 1 tablet (10 mg total) by mouth daily., Disp: 90 tablet, Rfl: 1 .  lisinopril (PRINIVIL,ZESTRIL) 20 MG tablet, Take 1 tablet (20 mg total) by mouth daily., Disp: 90 tablet, Rfl: 1 .  mesalamine (LIALDA) 1.2 g EC tablet, Take 4 tablets daily., Disp: 360 tablet, Rfl: 3   Allergies  Allergen Reactions  . Penicillins Hives    Has patient had a PCN reaction causing immediate rash, facial/tongue/throat swelling, SOB or lightheadedness with hypotension: no Has patient had a PCN reaction causing severe rash involving mucus membranes or skin necrosis: no Has patient had a PCN reaction that required hospitalization no Has patient had a PCN reaction occurring within the last 10 years: yes If all of the above answers are "NO", then may proceed with Cephalosporin use.  . Sulfa Antibiotics Other (See Comments)    unsure    Review of Systems   Review of Systems  Constitutional: Positive for malaise/fatigue. Negative for chills, fever and weight loss.  Respiratory: Negative for cough.   Cardiovascular: Negative for chest pain.      Vitals:   Vitals:   01/13/17 0821  BP: 140/90  Pulse: 87  Temp: 98.6 F (37 C)  TempSrc: Oral  SpO2: 96%  Weight: (!) 333 lb 8 oz (151.3 kg)  Height: 6' 2"  (1.88 m)     Body mass index is 42.82 kg/m.   Physical Exam:    Physical Exam  Constitutional: He appears well-developed. He is cooperative.  Non-toxic appearance. He does not have a sickly appearance. He does not appear ill. No distress.  Cardiovascular: Normal rate, regular rhythm, S1 normal, S2 normal, normal heart sounds and  normal pulses.   No LE edema  Pulmonary/Chest: Effort normal and breath sounds normal.  Neurological: He is alert.  Psychiatric: He has a normal mood and affect. His speech is normal and behavior is normal.  Pleasant and cheerful  Nursing note and vitals reviewed.      Assessment and Plan:    Najae was seen today for weight loss.  Diagnoses and all orders for this visit:  Morbid obesity ([place])  Weight loss counseling, encounter for   Discussed weight loss with patient. Made the following goals:  1. Consider stand-up desk. 2. Increase water intake to 2 bottles before lunch and 2 bottles after lunch going home. 3. Bulking  up your breakfast -- add in [place] Yogurt, handful of nuts, OR whole wheat slice of toast with peanut butter 4. Lunch -- choose side salads more often and try to avoid Ceaser (or cream-based dressings) -- choose [place] or vinaigrettes instead 5. Afternoon snack -- add in 1 snack each afternoon, around 3pm (or midway between lunch and dinner) 6. Dinner -- work on portion control! 7. Exercise: goal is to get to the gym 3 times a week  Provided balanced plate handout, breakfast/lunch road map, "Nutrition in the [employer]". Made a weight loss goal of approximately 1 lb per week. Follow-up in 2 months.  . Reviewed expectations re: course of current medical issues. . Discussed self-management of symptoms. . Outlined signs and symptoms indicating need for more acute intervention. . Patient verbalized understanding and all questions were answered. . See orders for this visit as documented in the electronic medical record. . Patient received an After Visit Summary.  CMA or LPN served as scribe during this visit. History, Physical, and Plan performed by medical provider. Documentation and orders reviewed and attested to.  Inda Coke, PA-C [place], [facility] 01/13/2017  Follow-up: Return in about 2 months (around 03/16/2017) for weight loss.

## 2017-01-13 ENCOUNTER — Encounter: Payer: Self-pay | Admitting: Physician Assistant

## 2017-01-13 ENCOUNTER — Ambulatory Visit (INDEPENDENT_AMBULATORY_CARE_PROVIDER_SITE_OTHER): Payer: 59 | Admitting: Physician Assistant

## 2017-01-13 DIAGNOSIS — Z713 Dietary counseling and surveillance: Secondary | ICD-10-CM | POA: Diagnosis not present

## 2017-01-13 NOTE — Patient Instructions (Addendum)
Consider stand-up desk.  Increase water intake to 2 bottles before lunch and 2 bottles after lunch going home.  Bulking up your breakfast -- add in [place] Yogurt, handful of nuts, OR whole wheat slice of toast with peanut butter  Lunch -- choose side salads more often and try to avoid Ceaser (or cream-based dressings) -- choose [place] or vinaigrettes instead  Afternoon snack -- add in 1 snack each afternoon, around 3pm (or midway between lunch and dinner)  Dinner -- work on portion control!  Exercise: goal is to get to the gym 3 times a week

## 2017-03-18 ENCOUNTER — Ambulatory Visit: Payer: 59 | Admitting: Physician Assistant

## 2017-06-07 ENCOUNTER — Other Ambulatory Visit: Payer: Self-pay | Admitting: Physician Assistant

## 2017-06-09 ENCOUNTER — Telehealth: Payer: Self-pay | Admitting: Physician Assistant

## 2017-06-09 MED ORDER — ESCITALOPRAM OXALATE 10 MG PO TABS: 10.0000 mg | ORAL_TABLET | Freq: Every day | ORAL | 1 refills | Status: DC

## 2017-06-09 MED ORDER — LISINOPRIL 20 MG PO TABS: 20.0000 mg | ORAL_TABLET | Freq: Every day | ORAL | 1 refills | Status: DC

## 2017-06-09 NOTE — Telephone Encounter (Signed)
MEDICATION: escitalopram (LEXAPRO) 10 MG tablet [[id number]]  lisinopril (PRINIVIL,ZESTRIL) 20 MG tablet [[id number]]   PHARMACY:   [id number] [place], [place] - [address](Phone) [phone](Fax)     IS THIS A 90 DAY SUPPLY : Yes  IS PATIENT OUT OF MEDICATION:  No   IF NOT; HOW MUCH IS LEFT: till 06/13/17   LAST APPOINTMENT DATE: @7 /08/2016  NEXT APPOINTMENT DATE:@12 /11/2016  OTHER COMMENTS:    **Let patient know to contact pharmacy at the end of the day to make sure medication is ready. **  ** Please notify patient to allow 48-72 hours to process**  **Encourage patient to contact the pharmacy for refills or they can [facility]*

## 2017-06-09 NOTE — Telephone Encounter (Signed)
Rx refills sent to pharmacy for Lexapro and Lisinopril.

## 2017-06-09 NOTE — Telephone Encounter (Signed)
Okay refill. 

## 2017-06-13 ENCOUNTER — Encounter: Payer: 59 | Admitting: Physician Assistant

## 2017-06-18 ENCOUNTER — Encounter: Payer: Self-pay | Admitting: Family Medicine

## 2017-06-18 ENCOUNTER — Other Ambulatory Visit: Payer: Self-pay

## 2017-06-18 ENCOUNTER — Ambulatory Visit (INDEPENDENT_AMBULATORY_CARE_PROVIDER_SITE_OTHER): Payer: 59 | Admitting: Family Medicine

## 2017-06-18 DIAGNOSIS — Z0000 Encounter for general adult medical examination without abnormal findings: Secondary | ICD-10-CM | POA: Diagnosis not present

## 2017-06-18 DIAGNOSIS — F419 Anxiety disorder, unspecified: Secondary | ICD-10-CM | POA: Diagnosis not present

## 2017-06-18 DIAGNOSIS — I10 Essential (primary) hypertension: Secondary | ICD-10-CM | POA: Diagnosis not present

## 2017-06-18 LAB — COMPREHENSIVE METABOLIC PANEL
ALT: 73 U/L — ABNORMAL HIGH (ref 0–53)
AST: 39 U/L — ABNORMAL HIGH (ref 0–37)
Albumin: 4.7 g/dL (ref 3.5–5.2)
Alkaline Phosphatase: 55 U/L (ref 39–117)
BUN: 19 mg/dL (ref 6–23)
CO2: 27 mEq/L (ref 19–32)
Calcium: 9.5 mg/dL (ref 8.4–10.5)
Chloride: 102 mEq/L (ref 96–112)
Creatinine, Ser: 0.98 mg/dL (ref 0.40–1.50)
GFR: 93.97 mL/min (ref >60.00)
Glucose, Bld: 103 mg/dL — ABNORMAL HIGH (ref 70–99)
Potassium: 4.5 mEq/L (ref 3.5–5.1)
Sodium: 136 mEq/L (ref 135–145)
Total Bilirubin: 0.6 mg/dL (ref 0.2–1.2)
Total Protein: 7.2 g/dL (ref 6.0–8.3)

## 2017-06-18 LAB — LIPID PANEL
Cholesterol: 206 mg/dL — ABNORMAL HIGH (ref 0–200)
HDL: 29.9 mg/dL — ABNORMAL LOW (ref >39.00)
NonHDL: 176.53
Total CHOL/HDL Ratio: 7
Triglycerides: 316 mg/dL — ABNORMAL HIGH (ref 0.0–149.0)
VLDL: 63.2 mg/dL — ABNORMAL HIGH (ref 0.0–40.0)

## 2017-06-18 LAB — CBC WITH DIFFERENTIAL/PLATELET
Basophils Absolute: 0 10*3/uL (ref 0.0–0.1)
Basophils Relative: 0.2 % (ref 0.0–3.0)
Eosinophils Absolute: 0.1 10*3/uL (ref 0.0–0.7)
Eosinophils Relative: 1.1 % (ref 0.0–5.0)
HCT: 46.4 % (ref 39.0–52.0)
Hemoglobin: 15.5 g/dL (ref 13.0–17.0)
Lymphocytes Relative: 32.7 % (ref 12.0–46.0)
Lymphs Abs: 2.2 10*3/uL (ref 0.7–4.0)
MCHC: 33.4 g/dL (ref 30.0–36.0)
MCV: 87.4 fl (ref 78.0–100.0)
Monocytes Absolute: 0.4 10*3/uL (ref 0.1–1.0)
Monocytes Relative: 6.4 % (ref 3.0–12.0)
Neutro Abs: 4.1 10*3/uL (ref 1.4–7.7)
Neutrophils Relative %: 59.6 % (ref 43.0–77.0)
Platelets: 318 10*3/uL (ref 150.0–400.0)
RBC: 5.31 Mil/uL (ref 4.22–5.81)
RDW: 13 % (ref 11.5–15.5)
WBC: 6.8 10*3/uL (ref 4.0–10.5)

## 2017-06-18 LAB — HEMOGLOBIN A1C: Hgb A1c MFr Bld: 5.7 % (ref 4.6–6.5)

## 2017-06-18 LAB — LDL CHOLESTEROL, DIRECT: Direct LDL: 137 mg/dL

## 2017-06-18 MED ORDER — ESCITALOPRAM OXALATE 10 MG PO TABS: 10.0000 mg | ORAL_TABLET | Freq: Every day | ORAL | 4 refills | Status: DC

## 2017-06-18 MED ORDER — LISINOPRIL 20 MG PO TABS: 20.0000 mg | ORAL_TABLET | Freq: Every day | ORAL | 4 refills | Status: DC

## 2017-06-18 NOTE — Progress Notes (Signed)
Subjective:    [name] is a 31 y.o. male who presents today for his Complete Annual Exam. He feels well. He reports exercising but not regularly. He reports he is sleeping fairly well.   Health Maintenance Due  Topic Date Due  . HIV Screening  01/25/2000   PMHx, SurgHx, SocialHx, Medications, and Allergies were reviewed in the Visit Navigator and updated as appropriate.   Past Medical History:  Diagnosis Date  . Hypertension   . Panic anxiety syndrome    Past Surgical History:  Procedure Laterality Date  . COLONOSCOPY N/A 05/24/2016   Procedure: COLONOSCOPY;  Surgeon: [name], MD;  Location: [facility];  Service: Endoscopy;  Laterality: N/A;  . ESOPHAGOGASTRODUODENOSCOPY N/A 05/24/2016   Procedure: ESOPHAGOGASTRODUODENOSCOPY (EGD);  Surgeon: [name], MD;  Location: [facility];  Service: Endoscopy;  Laterality: N/A;  . LUMBAR LAMINECTOMY/DECOMPRESSION MICRODISCECTOMY N/A 02/14/2016   Procedure: MICRO  LUMBER L4-5;  Surgeon: Susa Day, MD;  Location: [facility];  Service: Orthopedics;  Laterality: N/A;  . NO PAST SURGERIES      Family History  Problem Relation Age of Onset  . Hypertension Mother   . Diabetes Mother   . Hyperlipidemia Mother   . Hypertension Father   . Diabetes Father   . Inflammatory bowel disease Other   . Diabetes Other   . High Cholesterol Maternal Grandmother   . Hypertension Maternal Grandmother   . Heart disease Maternal Grandfather   . High Cholesterol Maternal Grandfather   . Hypertension Maternal Grandfather   . Stroke Maternal Grandfather   . Cancer Paternal Grandmother   . Diabetes Paternal Grandmother   . Heart disease Paternal Grandmother   . High Cholesterol Paternal Grandmother   . Hypertension Paternal Grandmother   . Heart disease Paternal Grandfather   . High Cholesterol Paternal Grandfather   . Hypertension Paternal Grandfather   . Stroke Paternal Grandfather    Social History   Tobacco Use  .  Smoking status: Never Smoker  . Smokeless tobacco: Never Used  Substance Use Topics  . Alcohol use: Yes    Comment: occ  . Drug use: No   Review of Systems:   Pertinent items are noted in the HPI. Otherwise, ROS is negative.  Objective:   Vitals:   06/18/17 0758  BP: (!) 150/88  Pulse: 100  Temp: 97.7 F (36.5 C)  SpO2: 97%   Body mass index is 42.57 kg/m.  General Appearance:  Alert, cooperative, no distress, appears stated age  Head:  Normocephalic, without obvious abnormality, atraumatic  Eyes:  PERRL, conjunctiva/corneas clear, EOM's intact, fundi benign, both eyes       Ears:  Normal TM's and external ear canals, both ears  Nose: Nares normal, septum midline, mucosa normal, no drainage    or sinus tenderness  Throat: Lips, mucosa, and tongue normal; teeth and gums normal  Neck: Supple, symmetrical, trachea midline, no adenopathy; thyroid:  No enlargement/tenderness/nodules; no carotit bruit or JVD  Back:   Symmetric, no curvature, ROM normal, no CVA tenderness  Lungs:   Clear to auscultation bilaterally, respirations unlabored  Chest wall:  No tenderness or deformity  Heart:  Regular rate and rhythm, S1 and S2 normal, no murmur, rub   or gallop  Abdomen:   Soft, non-tender, bowel sounds active all four quadrants, no masses, no organomegaly  Extremities: Extremities normal, atraumatic, no cyanosis or edema  Prostate: Not done.   Skin: Skin color, texture, turgor normal, no rashes or lesions  Lymph nodes: Cervical, supraclavicular, and axillary nodes normal  Neurologic: CNII-XII grossly intact. Normal strength, sensation and reflexes throughout    Assessment/Plan:   [name] was seen today for annual exam.  Diagnoses and all orders for this visit:  Morbid obesity ([place]) -     CBC with Differential/Platelet -     Comprehensive metabolic panel -     Lipid panel -     Hemoglobin A1c  Routine physical examination -     CBC with Differential/Platelet -      Comprehensive metabolic panel -     Lipid panel  Hypertension, unspecified type -     lisinopril (PRINIVIL,ZESTRIL) 20 MG tablet; Take 1 tablet (20 mg total) by mouth daily.  Anxiety -     escitalopram (LEXAPRO) 10 MG tablet; Take 1 tablet (10 mg total) by mouth daily.  Other orders -     Cancel: escitalopram (LEXAPRO) 10 MG tablet; Take 1 tablet (10 mg total) by mouth daily. -     Cancel: lisinopril (PRINIVIL,ZESTRIL) 20 MG tablet; Take 1 tablet (20 mg total) by mouth daily.    Patient Counseling: [x]   Nutrition: Stressed importance of moderation in sodium/caffeine intake, saturated fat and cholesterol, caloric balance, sufficient intake of fresh fruits, vegetables, and fiber.  [x]   Stressed the importance of regular exercise.   []   Substance Abuse: Discussed cessation/primary prevention of tobacco, alcohol, or other drug use; driving or other dangerous activities under the influence; availability of treatment for abuse.   [x]   Injury prevention: Discussed safety belts, safety helmets, smoke detector, smoking near bedding or upholstery.   []   Sexuality: Discussed sexually transmitted diseases, partner selection, use of condoms, avoidance of unintended pregnancy and contraceptive alternatives.   [x]   Dental health: Discussed importance of regular tooth brushing, flossing, and dental visits.  [x]   Health maintenance and immunizations reviewed. Please refer to Health maintenance section.    Briscoe Deutscher, DO [place]

## 2017-07-02 ENCOUNTER — Ambulatory Visit (INDEPENDENT_AMBULATORY_CARE_PROVIDER_SITE_OTHER): Payer: 59 | Admitting: Family Medicine

## 2017-07-02 ENCOUNTER — Encounter: Payer: Self-pay | Admitting: Family Medicine

## 2017-07-02 VITALS — BP 128/86 | HR 96 | Temp 98.1°F | Ht 74.0 in | Wt 336.4 lb

## 2017-07-02 DIAGNOSIS — R945 Abnormal results of liver function studies: Secondary | ICD-10-CM | POA: Diagnosis not present

## 2017-07-02 DIAGNOSIS — E782 Mixed hyperlipidemia: Secondary | ICD-10-CM | POA: Diagnosis not present

## 2017-07-02 DIAGNOSIS — E8881 Metabolic syndrome: Secondary | ICD-10-CM | POA: Diagnosis not present

## 2017-07-02 DIAGNOSIS — E88819 Insulin resistance, unspecified: Secondary | ICD-10-CM

## 2017-07-02 DIAGNOSIS — R7989 Other specified abnormal findings of blood chemistry: Secondary | ICD-10-CM

## 2017-07-02 MED ORDER — DULAGLUTIDE 0.75 MG/0.5ML ~~LOC~~ SOAJ: SUBCUTANEOUS | 1 refills | Status: DC

## 2017-07-02 MED ORDER — DULAGLUTIDE 0.75 MG/0.5ML ~~LOC~~ SOAJ: SUBCUTANEOUS | 0 refills | Status: DC

## 2017-07-02 MED ORDER — ROSUVASTATIN CALCIUM 10 MG PO TABS: 10.0000 mg | ORAL_TABLET | Freq: Every day | ORAL | 3 refills | Status: DC

## 2017-07-02 NOTE — Progress Notes (Addendum)
[name] is a 31 y.o. male is here for follow up.  History of Present Illness:   HPI: See Assessment and Plan section for Problem Based Charting of issues discussed today.  There are no preventive care reminders to display for this patient.   Depression screen [facility] 2/9 06/18/2017 12/12/2016  Decreased Interest 0 0  Down, Depressed, Hopeless 0 0  PHQ - 2 Score 0 0  Altered sleeping 2 -  Tired, decreased energy 0 -  Change in appetite 1 -  Feeling bad or failure about yourself  1 -  Trouble concentrating 1 -  Moving slowly or fidgety/restless 0 -  Suicidal thoughts 0 -  PHQ-9 Score 5 -  Difficult doing work/chores Not difficult at all -   PMHx, SurgHx, SocialHx, FamHx, Medications, and Allergies were reviewed in the Visit Navigator and updated as appropriate.   Patient Active Problem List   Diagnosis Date Noted  . Morbid obesity ([place]) 12/12/2016  . Ulcerative pancolitis without complication ([place])   . Gastrointestinal hemorrhage 05/22/2016  . HNP (herniated nucleus pulposus), lumbar 02/14/2016  . Spinal stenosis of lumbar region 02/14/2016  . Benign essential hypertension 03/28/2015  . Panic disorder with agoraphobia and moderate panic attacks 03/28/2015  . Controlled substance agreement signed 08/09/2014   Social History   Tobacco Use  . Smoking status: Never Smoker  . Smokeless tobacco: Never Used  Substance Use Topics  . Alcohol use: Yes    Comment: occ  . Drug use: No   Current Medications and Allergies:   Current Outpatient Medications:  .  escitalopram (LEXAPRO) 10 MG tablet, Take 1 tablet (10 mg total) by mouth daily., Disp: 30 tablet, Rfl: 4 .  lisinopril (PRINIVIL,ZESTRIL) 20 MG tablet, Take 1 tablet (20 mg total) by mouth daily., Disp: 30 tablet, Rfl: 4 .  mesalamine (LIALDA) 1.2 g EC tablet, TAKE 4 TABLETS BY MOUTH EVERY DAY, Disp: 360 tablet, Rfl: 3   Allergies  Allergen Reactions  . Penicillins Hives    Has patient had a PCN reaction  causing immediate rash, facial/tongue/throat swelling, SOB or lightheadedness with hypotension: no Has patient had a PCN reaction causing severe rash involving mucus membranes or skin necrosis: no Has patient had a PCN reaction that required hospitalization no Has patient had a PCN reaction occurring within the last 10 years: yes If all of the above answers are "NO", then may proceed with Cephalosporin use.  . Sulfa Antibiotics Other (See Comments)    unsure   Review of Systems   Pertinent items are noted in the HPI. Otherwise, ROS is negative.  Vitals:   Vitals:   07/02/17 0721  BP: 128/86  Pulse: 96  Temp: 98.1 F (36.7 C)  TempSrc: Oral  SpO2: 97%  Weight: (!) 336 lb 6.4 oz (152.6 kg)  Height: 6' 2"  (1.88 m)     Body mass index is 43.19 kg/m.   Physical Exam:   Physical Exam  Constitutional: He is oriented to person, place, and time. He appears well-developed and well-nourished. No distress.  HENT:  Head: Normocephalic and atraumatic.  Right Ear: External ear normal.  Left Ear: External ear normal.  Nose: Nose normal.  Mouth/Throat: Oropharynx is clear and moist.  Eyes: Conjunctivae and EOM are normal. Pupils are equal, round, and reactive to light.  Neck: Normal range of motion. Neck supple.  Cardiovascular: Normal rate, regular rhythm, normal heart sounds and intact distal pulses.  Pulmonary/Chest: Effort normal and breath sounds normal.  Abdominal: Soft.  Bowel sounds are normal.  Musculoskeletal: Normal range of motion.  Neurological: He is alert and oriented to person, place, and time.  Skin: Skin is warm and dry.  Psychiatric: He has a normal mood and affect. His behavior is normal. Judgment and thought content normal.  Nursing note and vitals reviewed.   Results for orders placed or performed in visit on 06/18/17  CBC with Differential/Platelet  Result Value Ref Range   WBC 6.8 4.0 - 10.5 K/uL   RBC 5.31 4.22 - 5.81 Mil/uL   Hemoglobin 15.5 13.0 - 17.0  g/dL   HCT 46.4 39.0 - 52.0 %   MCV 87.4 78.0 - 100.0 fl   MCHC 33.4 30.0 - 36.0 g/dL   RDW 13.0 11.5 - 15.5 %   Platelets 318.0 150.0 - 400.0 K/uL   Neutrophils Relative % 59.6 43.0 - 77.0 %   Lymphocytes Relative 32.7 12.0 - 46.0 %   Monocytes Relative 6.4 3.0 - 12.0 %   Eosinophils Relative 1.1 0.0 - 5.0 %   Basophils Relative 0.2 0.0 - 3.0 %   Neutro Abs 4.1 1.4 - 7.7 K/uL   Lymphs Abs 2.2 0.7 - 4.0 K/uL   Monocytes Absolute 0.4 0.1 - 1.0 K/uL   Eosinophils Absolute 0.1 0.0 - 0.7 K/uL   Basophils Absolute 0.0 0.0 - 0.1 K/uL  Comprehensive metabolic panel  Result Value Ref Range   Sodium 136 135 - 145 mEq/L   Potassium 4.5 3.5 - 5.1 mEq/L   Chloride 102 96 - 112 mEq/L   CO2 27 19 - 32 mEq/L   Glucose, Bld 103 (H) 70 - 99 mg/dL   BUN 19 6 - 23 mg/dL   Creatinine, Ser 0.98 0.40 - 1.50 mg/dL   Total Bilirubin 0.6 0.2 - 1.2 mg/dL   Alkaline Phosphatase 55 39 - 117 U/L   AST 39 (H) 0 - 37 U/L   ALT 73 (H) 0 - 53 U/L   Total Protein 7.2 6.0 - 8.3 g/dL   Albumin 4.7 3.5 - 5.2 g/dL   Calcium 9.5 8.4 - 10.5 mg/dL   GFR 93.97 >60.00 mL/min  Lipid panel  Result Value Ref Range   Cholesterol 206 (H) 0 - 200 mg/dL   Triglycerides 316.0 (H) 0.0 - 149.0 mg/dL   HDL 29.90 (L) >39.00 mg/dL   VLDL 63.2 (H) 0.0 - 40.0 mg/dL   Total CHOL/HDL Ratio 7    NonHDL 176.53   Hemoglobin A1c  Result Value Ref Range   Hgb A1c MFr Bld 5.7 4.6 - 6.5 %  LDL cholesterol, direct  Result Value Ref Range   Direct LDL 137.0 mg/dL   Assessment and Plan:   [name] was seen today for follow-up.  Diagnoses and all orders for this visit:  Morbid obesity ([place]) Comments: Contraindications to weight loss: none. Patient readiness to commit to diet and activity changes: excellent. Barriers to weight loss: none.  Plan: 1. Diagnostic studies to rule out secondary causes of obesity: see below. 2. General patient education:   Average sustained weight loss in long-term studies w/lifestyle  interventions alone is 10-15 lb.  Importance of long-term maintenance tx in weight loss.  Use non-food self-rewards to reinforce behavior changes.  Elicit support from others; identify saboteurs.  Practical target weight is usually around 2 BMI units below current weight. 3. Diet interventions:   Risks of dieting were reviewed, including fatigue, temporary hair loss, gallstone formation, gout, and with very low calorie diets, electrolyte abnormalities, nutrient inadequacies, and loss  of lean body mass. 4. Exercise intervention:   Informal measures, e.g. taking stairs instead of elevator.  Formal exercise regimen options. 5. Other behavioral treatment: stress management. 6. Other treatment: Medication: Trulicity. 7. Patient to keep a weight log that we will review at follow up. 8. Follow up: 3 months and as needed.  Orders: -     Dulaglutide (TRULICITY) 5.43 KG/6.7PC SOPN; 0.75 mg [place] q wk x 2 wks , then increase to 1.5 mg [place] if tolerating  LFTs abnormal Comments:  Lab Results  Component Value Date   ALT 73 (H) 06/18/2017   AST 39 (H) 06/18/2017   ALKPHOS 55 06/18/2017   BILITOT 0.6 06/18/2017   Worsening. On Lialda, Hx of UC. We discussed his trend. He will discuss this with his GI doc to make sure that he should continue dose. Will work on weight loss and controlling lipids/insulin levels.   Mixed hyperlipidemia Comments:  Lab Results  Component Value Date   CHOL 206 (H) 06/18/2017   HDL 29.90 (L) 06/18/2017   LDLDIRECT 137.0 06/18/2017   TRIG 316.0 (H) 06/18/2017   CHOLHDL 7 06/18/2017   Orders: -     rosuvastatin (CRESTOR) 10 MG tablet; Take 1 tablet (10 mg total) by mouth daily.  Insulin resistance Comments:  Lab Results  Component Value Date   HGBA1C 5.7 06/18/2017   The patient is asked to make an attempt to improve diet and exercise patterns to aid in medical management of this problem.   Orders: -     Dulaglutide (TRULICITY) 3.40 BT/2.4EL SOPN; 0.75  mg [place] q wk x 2 wks , then increase to 1.5 mg [place] if tolerating   . Reviewed expectations re: course of current medical issues. . Discussed self-management of symptoms. . Outlined signs and symptoms indicating need for more acute intervention. . Patient verbalized understanding and all questions were answered. Marland Kitchen Health Maintenance issues including appropriate healthy diet, exercise, and smoking avoidance were discussed with patient. . See orders for this visit as documented in the electronic medical record. . Patient received an After Visit Summary.  Briscoe Deutscher, DO [place], [facility] 07/05/2017  Future Appointments  Date Time Provider [place]  08/06/2017  7:20 AM Briscoe Deutscher, DO [facility]  12/17/2017  8:00 AM Inda Coke, PA [facility]

## 2017-07-14 ENCOUNTER — Encounter: Payer: Self-pay | Admitting: Family Medicine

## 2017-07-17 ENCOUNTER — Ambulatory Visit: Payer: 59 | Admitting: Family Medicine

## 2017-07-18 ENCOUNTER — Encounter: Payer: Self-pay | Admitting: Gastroenterology

## 2017-08-06 ENCOUNTER — Ambulatory Visit: Payer: 59 | Admitting: Family Medicine

## 2017-08-25 ENCOUNTER — Encounter: Payer: Self-pay | Admitting: Family Medicine

## 2017-08-25 ENCOUNTER — Ambulatory Visit (INDEPENDENT_AMBULATORY_CARE_PROVIDER_SITE_OTHER): Payer: Commercial Managed Care - PPO | Admitting: Family Medicine

## 2017-08-25 VITALS — BP 142/88 | HR 102 | Temp 98.3°F | Ht 74.0 in | Wt 333.4 lb

## 2017-08-25 DIAGNOSIS — R7989 Other specified abnormal findings of blood chemistry: Secondary | ICD-10-CM

## 2017-08-25 DIAGNOSIS — E782 Mixed hyperlipidemia: Secondary | ICD-10-CM | POA: Diagnosis not present

## 2017-08-25 DIAGNOSIS — E8881 Metabolic syndrome: Secondary | ICD-10-CM | POA: Diagnosis not present

## 2017-08-25 DIAGNOSIS — J0100 Acute maxillary sinusitis, unspecified: Secondary | ICD-10-CM | POA: Diagnosis not present

## 2017-08-25 DIAGNOSIS — I10 Essential (primary) hypertension: Secondary | ICD-10-CM | POA: Diagnosis not present

## 2017-08-25 DIAGNOSIS — R945 Abnormal results of liver function studies: Secondary | ICD-10-CM | POA: Diagnosis not present

## 2017-08-25 DIAGNOSIS — E88819 Insulin resistance, unspecified: Secondary | ICD-10-CM

## 2017-08-25 MED ORDER — DULAGLUTIDE 1.5 MG/0.5ML ~~LOC~~ SOAJ: 1.5000 mg | SUBCUTANEOUS | 12 refills | Status: DC

## 2017-08-25 MED ORDER — AZITHROMYCIN 250 MG PO TABS: ORAL_TABLET | ORAL | 0 refills | Status: DC

## 2017-08-25 NOTE — Progress Notes (Signed)
[name] is a 32 y.o. male is here to [facility].   Patient Care Team: Inda Coke, [place] as PCP - General (Physician Assistant)   History of Present Illness:   Shaune Pascal CMA acting as scribe for Dr. Juleen China.  HPI: Patient comes in today for a follow up. He has been tolerating the Trulicity very well. No symptoms with taking medication.   There are no preventive care reminders to display for this patient. Depression screen PHQ 2/9 06/18/2017  Decreased Interest 0  Down, Depressed, Hopeless 0  PHQ - 2 Score 0  Altered sleeping 2  Tired, decreased energy 0  Change in appetite 1  Feeling bad or failure about yourself  1  Trouble concentrating 1  Moving slowly or fidgety/restless 0  Suicidal thoughts 0  PHQ-9 Score 5  Difficult doing work/chores Not difficult at all   PMHx, SurgHx, SocialHx, Medications, and Allergies were reviewed in the Visit Navigator and updated as appropriate.   Past Medical History:  Diagnosis Date  . Hypertension   . Panic anxiety syndrome    Past Surgical History:  Procedure Laterality Date  . COLONOSCOPY N/A 05/24/2016   Procedure: COLONOSCOPY;  Surgeon: [name], MD;  Location: [facility];  Service: Endoscopy;  Laterality: N/A;  . ESOPHAGOGASTRODUODENOSCOPY N/A 05/24/2016   Procedure: ESOPHAGOGASTRODUODENOSCOPY (EGD);  Surgeon: [name], MD;  Location: [facility];  Service: Endoscopy;  Laterality: N/A;  . LUMBAR LAMINECTOMY/DECOMPRESSION MICRODISCECTOMY N/A 02/14/2016   Procedure: MICRO  LUMBER L4-5;  Surgeon: Susa Day, MD;  Location: [facility];  Service: Orthopedics;  Laterality: N/A;  . NO PAST SURGERIES     Family History  Problem Relation Age of Onset  . Hypertension Mother   . Diabetes Mother   . Hyperlipidemia Mother   . Hypertension Father   . Diabetes Father   . Inflammatory bowel disease Other   . Diabetes Other   . High Cholesterol Maternal Grandmother   . Hypertension Maternal Grandmother    . Heart disease Maternal Grandfather   . High Cholesterol Maternal Grandfather   . Hypertension Maternal Grandfather   . Stroke Maternal Grandfather   . Cancer Paternal Grandmother   . Diabetes Paternal Grandmother   . Heart disease Paternal Grandmother   . High Cholesterol Paternal Grandmother   . Hypertension Paternal Grandmother   . Heart disease Paternal Grandfather   . High Cholesterol Paternal Grandfather   . Hypertension Paternal Grandfather   . Stroke Paternal Grandfather    Social History   Tobacco Use  . Smoking status: Never Smoker  . Smokeless tobacco: Never Used  Substance Use Topics  . Alcohol use: Yes    Comment: occ  . Drug use: No    Current Medications and Allergies:   Current Outpatient Medications:  .  Dulaglutide (TRULICITY) 9.50 DT/2.6ZT SOPN, 0.75 mg [place] q wk x 2 wks , then increase to 1.5 mg [place] if tolerating, Disp: 5 pen, Rfl: 1 .  Dulaglutide (TRULICITY) 2.45 YK/9.9IP SOPN, 0.75 mg [place] q wk x 2 wks , then increase to 1.5 mg [place] if tolerating, Disp: 2 pen, Rfl: 0 .  escitalopram (LEXAPRO) 10 MG tablet, Take 1 tablet (10 mg total) by mouth daily., Disp: 30 tablet, Rfl: 4 .  lisinopril (PRINIVIL,ZESTRIL) 20 MG tablet, Take 1 tablet (20 mg total) by mouth daily., Disp: 30 tablet, Rfl: 4 .  mesalamine (LIALDA) 1.2 g EC tablet, TAKE 4 TABLETS BY MOUTH EVERY DAY, Disp: 360 tablet, Rfl: 3 .  rosuvastatin (CRESTOR) 10 MG tablet, Take 1 tablet (10 mg total) by mouth daily., Disp: 30 tablet, Rfl: 3  Allergies  Allergen Reactions  . Penicillins Hives    Has patient had a PCN reaction causing immediate rash, facial/tongue/throat swelling, SOB or lightheadedness with hypotension: no Has patient had a PCN reaction causing severe rash involving mucus membranes or skin necrosis: no Has patient had a PCN reaction that required hospitalization no Has patient had a PCN reaction occurring within the last 10 years: yes If all of the above answers are "NO", then may  proceed with Cephalosporin use.  . Sulfa Antibiotics Other (See Comments)    unsure   Review of Systems:   Pertinent items are noted in the HPI. Otherwise, ROS is negative.  Vitals:   Vitals:   08/25/17 0840  BP: (!) 142/88  Pulse: (!) 102  Temp: 98.3 F (36.8 C)  TempSrc: Oral  SpO2: 97%  Weight: (!) 333 lb 6.4 oz (151.2 kg)  Height: _0  (1.88 m)     Body mass index is 42.81 kg/m.  Physical Exam:   Physical Exam  Constitutional: He is oriented to person, place, and time. He appears well-developed and well-nourished. No distress.  HENT:  Head: Normocephalic and atraumatic.  Right Ear: External ear normal.  Left Ear: External ear normal.  Nose: Nose normal.  Mouth/Throat: Oropharynx is clear and moist.  Eyes: Conjunctivae and EOM are normal. Pupils are equal, round, and reactive to light.  Neck: Normal range of motion. Neck supple.  Cardiovascular: Normal rate, regular rhythm, normal heart sounds and intact distal pulses.  Pulmonary/Chest: Effort normal and breath sounds normal.  Abdominal: Soft. Bowel sounds are normal.  Musculoskeletal: Normal range of motion.  Neurological: He is alert and oriented to person, place, and time.  Skin: Skin is warm and dry.  Psychiatric: He has a normal mood and affect. His behavior is normal. Judgment and thought content normal.  Nursing note and vitals reviewed.  Results for orders placed or performed in visit on 06/18/17  CBC with Differential/Platelet  Result Value Ref Range   WBC 6.8 4.0 - 10.5 K/uL   RBC 5.31 4.22 - 5.81 Mil/uL   Hemoglobin 15.5 13.0 - 17.0 g/dL   HCT 46.4 39.0 - 52.0 %   MCV 87.4 78.0 - 100.0 fl   MCHC 33.4 30.0 - 36.0 g/dL   RDW 13.0 11.5 - 15.5 %   Platelets 318.0 150.0 - 400.0 K/uL   Neutrophils Relative % 59.6 43.0 - 77.0 %   Lymphocytes Relative 32.7 12.0 - 46.0 %   Monocytes Relative 6.4 3.0 - 12.0 %   Eosinophils Relative 1.1 0.0 - 5.0 %   Basophils Relative 0.2 0.0 - 3.0 %   Neutro Abs 4.1  1.4 - 7.7 K/uL   Lymphs Abs 2.2 0.7 - 4.0 K/uL   Monocytes Absolute 0.4 0.1 - 1.0 K/uL   Eosinophils Absolute 0.1 0.0 - 0.7 K/uL   Basophils Absolute 0.0 0.0 - 0.1 K/uL  Comprehensive metabolic panel  Result Value Ref Range   Sodium 136 135 - 145 mEq/L   Potassium 4.5 3.5 - 5.1 mEq/L   Chloride 102 96 - 112 mEq/L   CO2 27 19 - 32 mEq/L   Glucose, Bld 103 (H) 70 - 99 mg/dL   BUN 19 6 - 23 mg/dL   Creatinine, Ser 0.98 0.40 - 1.50 mg/dL   Total Bilirubin 0.6 0.2 - 1.2 mg/dL   Alkaline Phosphatase 55 39 - 117  U/L   AST 39 (H) 0 - 37 U/L   ALT 73 (H) 0 - 53 U/L   Total Protein 7.2 6.0 - 8.3 g/dL   Albumin 4.7 3.5 - 5.2 g/dL   Calcium 9.5 8.4 - 10.5 mg/dL   GFR 93.97 >60.00 mL/min  Lipid panel  Result Value Ref Range   Cholesterol 206 (H) 0 - 200 mg/dL   Triglycerides 316.0 (H) 0.0 - 149.0 mg/dL   HDL 29.90 (L) >39.00 mg/dL   VLDL 63.2 (H) 0.0 - 40.0 mg/dL   Total CHOL/HDL Ratio 7    NonHDL 176.53   Hemoglobin A1c  Result Value Ref Range   Hgb A1c MFr Bld 5.7 4.6 - 6.5 %  LDL cholesterol, direct  Result Value Ref Range   Direct LDL 137.0 mg/dL   Assessment and Plan:   1. Benign essential hypertension Slightly elevated today.  The patient has been taking over-the-counter decongestants for his sinus pain and pressure.  No red flags.  2. Morbid obesity ([place]) Weight is down a few pounds.  He is tolerating the Trulicity very well.  We will increase to 1.5 mg dose.  Recheck in 3 months.  3. Mixed hyperlipidemia Recheck in 3 months.  Future orders completed.  - Lipid panel; Future - Comp Met (CMET); Future  4. LFTs abnormal Recheck in 3 months.  Future orders completed.  - Comp Met (CMET); Future  5. Subacute maxillary sinusitis Patient presents with over 1 week of sinus pain and pressure.  This started a little over week ago with a right upper respiratory infection.  He used tele-doc for the diagnosis.  His cough is lingering and worse at night.  It is nonproductive.   However he is having the pressure in the sinuses.  - azithromycin (ZITHROMAX) 250 MG tablet; Two tabs on day one then 1 tab daily after  Dispense: 6 tablet; Refill: 0  6. Insulin resistance Continue current treatment.  Increase dose to 1.5 mg weekly.  Recheck A1c in 3 months.  Future orders added.  - Dulaglutide (TRULICITY) 1.5 YV/8.5FY SOPN; Inject 1.5 mg into the skin once a week.  Dispense: 3 pen; Refill: 12 - Hemoglobin A1c; Future   . Reviewed expectations re: course of current medical issues. . Discussed self-management of symptoms. . Outlined signs and symptoms indicating need for more acute intervention. . Patient verbalized understanding and all questions were answered. Marland Kitchen Health Maintenance issues including appropriate healthy diet, exercise, and smoking avoidance were discussed with patient. . See orders for this visit as documented in the electronic medical record. . Patient received an After Visit Summary.  CMA served as Education administrator during this visit. History, Physical, and Plan performed by medical provider. The above documentation has been reviewed and is accurate and complete. Briscoe Deutscher, D.O.   Briscoe Deutscher, DO [place], [facility] 08/25/2017

## 2017-08-26 ENCOUNTER — Other Ambulatory Visit: Payer: Self-pay | Admitting: Family Medicine

## 2017-08-26 DIAGNOSIS — E8881 Metabolic syndrome: Secondary | ICD-10-CM

## 2017-10-27 IMAGING — DX DG LUMBAR SPINE 2-3V
2 series · 2 of 2 positions shown · non-contrast
Comparison: Scratched No prior.

ADDENDUM:
There are assumed to be 5 lumbar type non rib-bearing vertebral
bodies. S1 is considered transitional. There is no chest x-ray
available in PACs for rib numbering.

This addendum was created by Dr. Emmanuel Yaw Denkey, [REDACTED].
CLINICAL DATA: Preop lumbar surgery.  No prior .
EXAM:
LUMBAR SPINE - 2-3 VIEW

[l-spine ap]
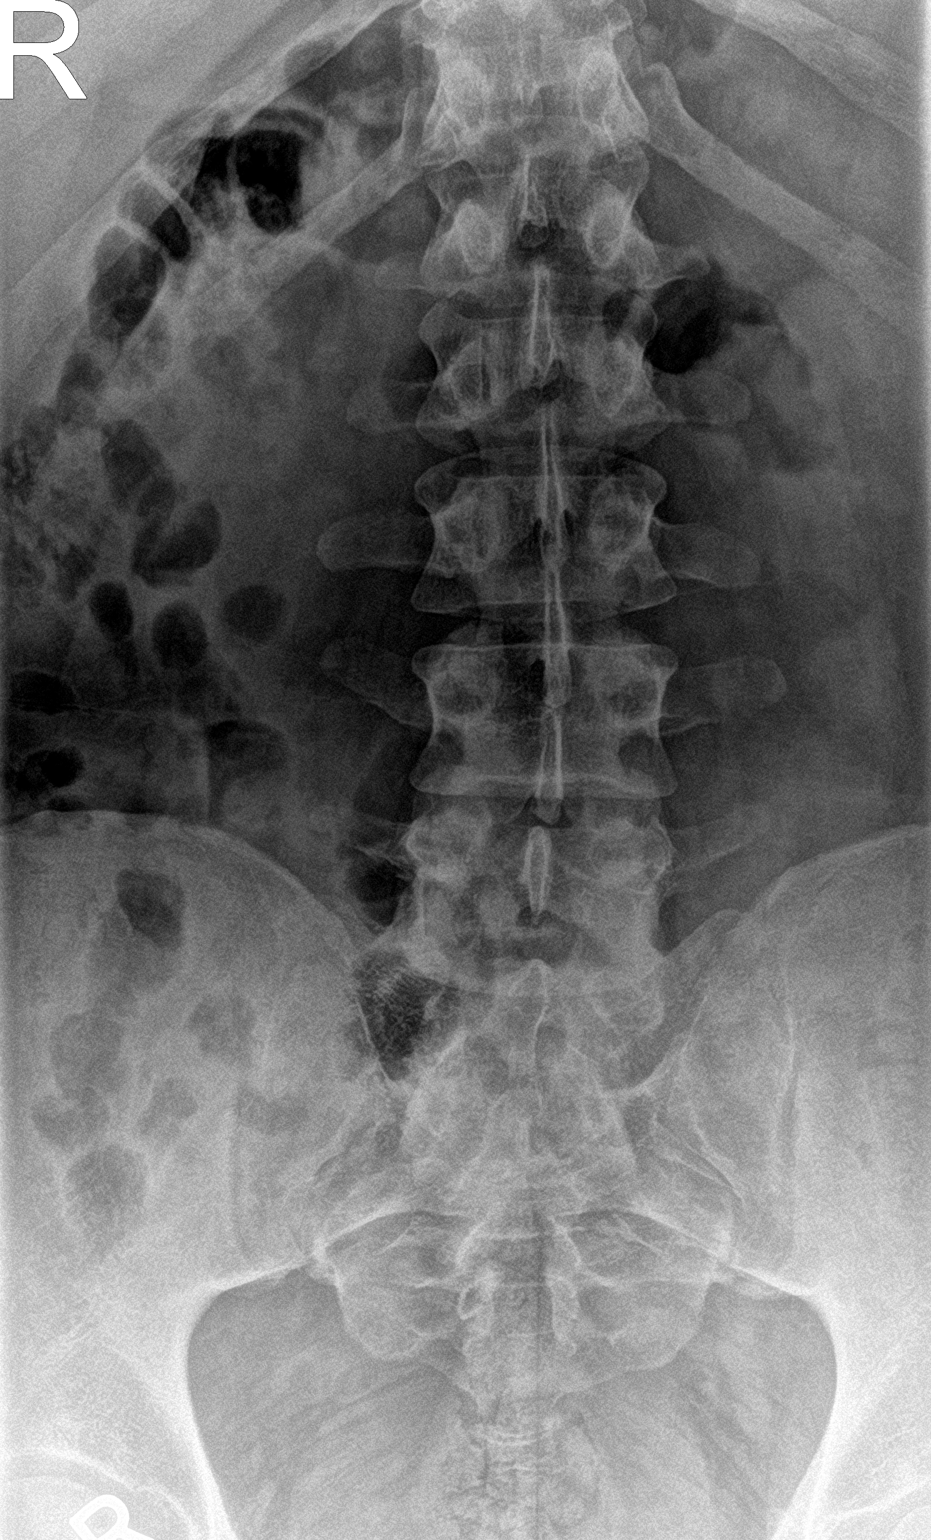

[l-spine lat]
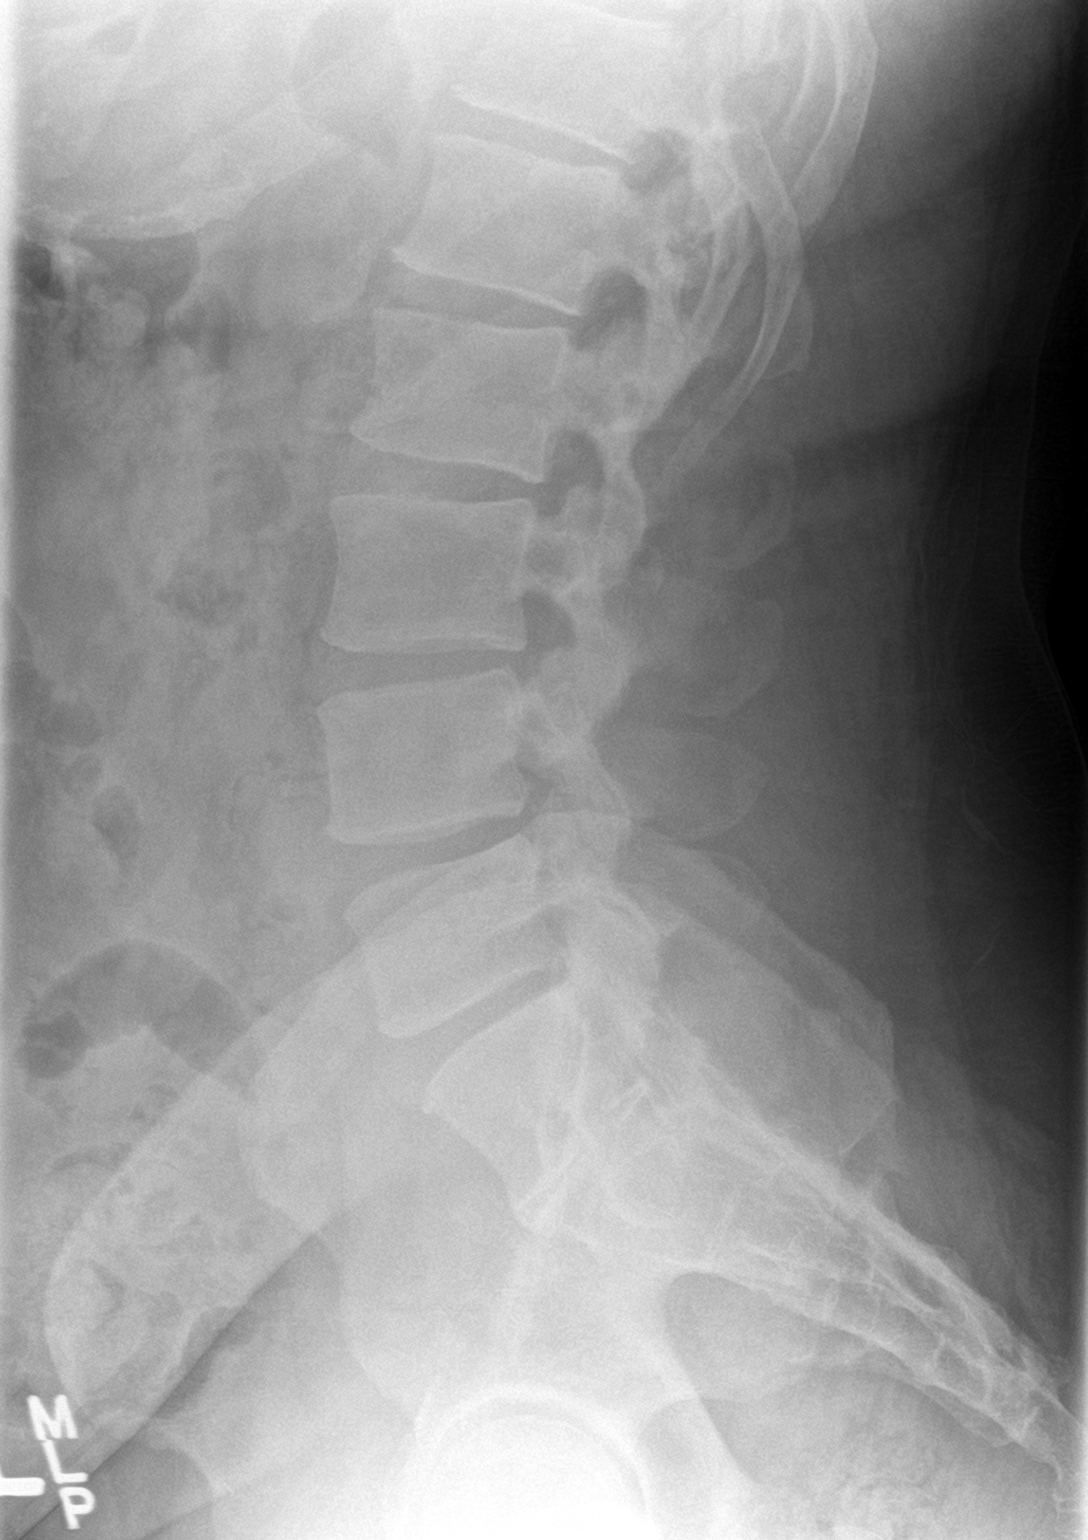

[2 of 2 positions shown; findings below may reference images not displayed]

FINDINGS: No acute soft tissue or bony abnormality. Normal alignment and
mineralization. No bowel distention .
IMPRESSION: No acute or focal abnormality.

## 2017-11-02 ENCOUNTER — Other Ambulatory Visit: Payer: Self-pay | Admitting: Family Medicine

## 2017-11-02 DIAGNOSIS — E782 Mixed hyperlipidemia: Secondary | ICD-10-CM

## 2017-11-21 ENCOUNTER — Other Ambulatory Visit (INDEPENDENT_AMBULATORY_CARE_PROVIDER_SITE_OTHER): Payer: Commercial Managed Care - PPO

## 2017-11-21 DIAGNOSIS — E782 Mixed hyperlipidemia: Secondary | ICD-10-CM | POA: Diagnosis not present

## 2017-11-21 DIAGNOSIS — R7989 Other specified abnormal findings of blood chemistry: Secondary | ICD-10-CM

## 2017-11-21 DIAGNOSIS — E8881 Metabolic syndrome: Secondary | ICD-10-CM | POA: Diagnosis not present

## 2017-11-21 DIAGNOSIS — R945 Abnormal results of liver function studies: Secondary | ICD-10-CM

## 2017-11-21 LAB — COMPREHENSIVE METABOLIC PANEL
ALT: 86 U/L — ABNORMAL HIGH (ref 0–53)
AST: 43 U/L — ABNORMAL HIGH (ref 0–37)
Albumin: 4.4 g/dL (ref 3.5–5.2)
Alkaline Phosphatase: 47 U/L (ref 39–117)
BUN: 16 mg/dL (ref 6–23)
CO2: 29 mEq/L (ref 19–32)
Calcium: 9.3 mg/dL (ref 8.4–10.5)
Chloride: 103 mEq/L (ref 96–112)
Creatinine, Ser: 0.99 mg/dL (ref 0.40–1.50)
GFR: 92.63 mL/min (ref >60.00)
Glucose, Bld: 89 mg/dL (ref 70–99)
Potassium: 4.6 mEq/L (ref 3.5–5.1)
Sodium: 138 mEq/L (ref 135–145)
Total Bilirubin: 0.5 mg/dL (ref 0.2–1.2)
Total Protein: 6.7 g/dL (ref 6.0–8.3)

## 2017-11-21 LAB — LIPID PANEL
Cholesterol: 150 mg/dL (ref 0–200)
HDL: 28.8 mg/dL — ABNORMAL LOW (ref >39.00)
LDL Cholesterol: 87 mg/dL (ref 0–99)
NonHDL: 121.11
Total CHOL/HDL Ratio: 5
Triglycerides: 171 mg/dL — ABNORMAL HIGH (ref 0.0–149.0)
VLDL: 34.2 mg/dL (ref 0.0–40.0)

## 2017-11-21 LAB — HEMOGLOBIN A1C: Hgb A1c MFr Bld: 5.5 % (ref 4.6–6.5)

## 2017-11-24 ENCOUNTER — Encounter: Payer: Self-pay | Admitting: Physician Assistant

## 2017-11-24 ENCOUNTER — Ambulatory Visit (INDEPENDENT_AMBULATORY_CARE_PROVIDER_SITE_OTHER): Payer: Commercial Managed Care - PPO | Admitting: Physician Assistant

## 2017-11-24 VITALS — BP 160/100 | HR 85 | Temp 98.3°F | Ht 74.0 in | Wt 336.0 lb

## 2017-11-24 DIAGNOSIS — R945 Abnormal results of liver function studies: Secondary | ICD-10-CM | POA: Diagnosis not present

## 2017-11-24 DIAGNOSIS — I10 Essential (primary) hypertension: Secondary | ICD-10-CM

## 2017-11-24 DIAGNOSIS — E782 Mixed hyperlipidemia: Secondary | ICD-10-CM

## 2017-11-24 DIAGNOSIS — R7989 Other specified abnormal findings of blood chemistry: Secondary | ICD-10-CM

## 2017-11-24 MED ORDER — LORCASERIN HCL 10 MG PO TABS: 10.0000 mg | ORAL_TABLET | Freq: Two times a day (BID) | ORAL | 2 refills | Status: DC

## 2017-11-24 NOTE — Assessment & Plan Note (Signed)
Long discussion with Dr. Briscoe Deutscher and patient regarding choices for treatment.  Trulicity is not working for patient -- will discontinue at this time.  Due to hypertension will avoid phentermine and qsymia.  Due to GI history will avoid metformin and orlistat.  Today's visit I offered patient 3 options: Initiation of Belviq, consult with Dr. Dennard Nip for medical supervised diet, or referral for bariatric surgery consult.  Patient has opted for initiation of Belviq.  We are going to follow-up in 3 months, sooner if needed.  He is not comfortable with discussing bariatric surgery at this time.

## 2017-11-24 NOTE — Progress Notes (Signed)
[name] is a 32 y.o. male is here to discuss: Weight Loss and Hyperlipdemia  I acted as a Education administrator for [employer], PA-C Anselmo Pickler, LPN  History of Present Illness:   Chief Complaint  Patient presents with  . Weight Loss  . Hyperlipidemia    Hyperlipidemia  This is a chronic problem. The problem is controlled. Recent lipid tests were reviewed and are variable. Exacerbating diseases include obesity. Current antihyperlipidemic treatment includes diet change and exercise (Trulicity 1.5 mg once a week). The current treatment provides moderate improvement of lipids. There are no compliance problems.  Risk factors for coronary artery disease include obesity, male sex and hypertension.   Lipid Panel     Component Value Date/Time   CHOL 150 11/21/2017 0758   TRIG 171.0 (H) 11/21/2017 0758   HDL 28.80 (L) 11/21/2017 0758   CHOLHDL 5 11/21/2017 0758   VLDL 34.2 11/21/2017 0758   LDLCALC 87 11/21/2017 0758   LDLDIRECT 137.0 06/18/2017 0838   Hepatic Function Latest Ref Rng & Units 11/21/2017 06/18/2017 01/03/2017  Total Protein 6.0 - 8.3 g/dL 6.7 7.2 6.9  Albumin 3.5 - 5.2 g/dL 4.4 4.7 4.3  AST 0 - 37 U/L 43(H) 39(H) 35  ALT 0 - 53 U/L 86(H) 73(H) 66(H)  Alk Phosphatase 39 - 117 U/L 47 55 53  Total Bilirubin 0.2 - 1.2 mg/dL 0.5 0.6 0.3   He is tolerating Crestor, however we do note that his AST and ALT are slowly increasing.  Per chart review it appears that patient has had elevated liver enzymes in the past and he states that if he remembers correctly, GI was suspecting that it was due to 1 of the medications he was on for his colitis.  Weight Loss Pt here for 3 month follow up on Trulicity 1.5 mg weekly tolerating well. Pt is up 3 lbs since last visit. Pt walking 3-4 days a week 30 minutes --> walking around neighborhood or playing golf, reports that typically his heart rate does not get that elevated denies getting short of breath. Initially was helping with  appetite suppression. Does have fatigue for 1-2 days after the injection, but then feels fine.   Drinks mainly water throughout the day. Occasional juice for breakfast. Lunch is usually out, dinner is at home.   Wt Readings from Last 6 Encounters:  11/24/17 (!) 336 lb (152.4 kg)  08/25/17 (!) 333 lb 6.4 oz (151.2 kg)  07/02/17 (!) 336 lb 6.4 oz (152.6 kg)  06/18/17 (!) 331 lb 9.6 oz (150.4 kg)  01/13/17 (!) 333 lb 8 oz (151.3 kg)  01/03/17 (!) 337 lb 4 oz (153 kg)   Body mass index is 43.14 kg/m.  HTN Currently taking Lisinopril 20 mg. At home blood pressure readings are: 120/80. Patient denies chest pain, SOB, blurred vision, dizziness, unusual headaches, lower leg swelling. Patient is compliant with medication. Denies excessive caffeine intake, stimulant usage, excessive alcohol intake, or increase in salt consumption.  Blood pressure monitor that he has at home that he is using is a wrist cuff.  BP Readings from Last 3 Encounters:  11/24/17 (!) 160/100  08/25/17 (!) 142/88  07/02/17 128/86     There are no preventive care reminders to display for this patient.  Past Medical History:  Diagnosis Date  . Hypertension   . Panic anxiety syndrome      Social History   Socioeconomic History  . Marital status: Married    Spouse name: Not on file  .  Number of children: 0  . Years of education: Not on file  . Highest education level: Not on file  Occupational History  . Not on file  Social Needs  . Financial resource strain: Not on file  . Food insecurity:    Worry: Not on file    Inability: Not on file  . Transportation needs:    Medical: Not on file    Non-medical: Not on file  Tobacco Use  . Smoking status: Never Smoker  . Smokeless tobacco: Never Used  Substance and Sexual Activity  . Alcohol use: Yes    Comment: occ  . Drug use: No  . Sexual activity: Yes    Partners: Female  Lifestyle  . Physical activity:    Days per week: Not on file    Minutes per  session: Not on file  . Stress: Not on file  Relationships  . Social connections:    Talks on phone: Not on file    Gets together: Not on file    Attends religious service: Not on file    Active member of club or organization: Not on file    Attends meetings of clubs or organizations: Not on file    Relationship status: Not on file  . Intimate partner violence:    Fear of current or ex partner: Not on file    Emotionally abused: Not on file    Physically abused: Not on file    Forced sexual activity: Not on file  Other Topics Concern  . Not on file  Social History Narrative   Accounting Department for [employer]   Married, no children   Fun: golf    Past Surgical History:  Procedure Laterality Date  . COLONOSCOPY N/A 05/24/2016   Procedure: COLONOSCOPY;  Surgeon: [name], MD;  [facility]: [facility];  Service: Endoscopy;  Laterality: N/A;  . ESOPHAGOGASTRODUODENOSCOPY N/A 05/24/2016   Procedure: ESOPHAGOGASTRODUODENOSCOPY (EGD);  Surgeon: [name], MD;  Location: [facility];  Service: Endoscopy;  Laterality: N/A;  . LUMBAR LAMINECTOMY/DECOMPRESSION MICRODISCECTOMY N/A 02/14/2016   Procedure: MICRO  LUMBER L4-5;  Surgeon: Susa Day, MD;  Location: [facility];  Service: Orthopedics;  Laterality: N/A;  . NO PAST SURGERIES      Family History  Problem Relation Age of Onset  . Hypertension Mother   . Diabetes Mother   . Hyperlipidemia Mother   . Hypertension Father   . Diabetes Father   . Inflammatory bowel disease Other   . Diabetes Other   . High Cholesterol Maternal Grandmother   . Hypertension Maternal Grandmother   . Heart disease Maternal Grandfather   . High Cholesterol Maternal Grandfather   . Hypertension Maternal Grandfather   . Stroke Maternal Grandfather   . Cancer Paternal Grandmother   . Diabetes Paternal Grandmother   . Heart disease Paternal Grandmother   . High Cholesterol Paternal Grandmother   . Hypertension Paternal Grandmother   .  Heart disease Paternal Grandfather   . High Cholesterol Paternal Grandfather   . Hypertension Paternal Grandfather   . Stroke Paternal Grandfather     PMHx, SurgHx, SocialHx, FamHx, Medications, and Allergies were reviewed in the Visit Navigator and updated as appropriate.   Patient Active Problem List   Diagnosis Date Noted  . Mixed hyperlipidemia 11/24/2017  . LFTs abnormal 11/24/2017  . Morbid obesity ([place]) 12/12/2016  . Ulcerative pancolitis without complication ([place])   . Gastrointestinal hemorrhage 05/22/2016  . HNP (herniated nucleus pulposus), lumbar 02/14/2016  . Spinal  stenosis of lumbar region 02/14/2016  . Benign essential hypertension 03/28/2015  . Panic disorder with agoraphobia and moderate panic attacks 03/28/2015  . Controlled substance agreement signed 08/09/2014    Social History   Tobacco Use  . Smoking status: Never Smoker  . Smokeless tobacco: Never Used  Substance Use Topics  . Alcohol use: Yes    Comment: occ  . Drug use: No    Current Medications and Allergies:    Current Outpatient Medications:  .  escitalopram (LEXAPRO) 10 MG tablet, Take 1 tablet (10 mg total) by mouth daily., Disp: 30 tablet, Rfl: 4 .  lisinopril (PRINIVIL,ZESTRIL) 20 MG tablet, Take 1 tablet (20 mg total) by mouth daily., Disp: 30 tablet, Rfl: 4 .  mesalamine (LIALDA) 1.2 g EC tablet, TAKE 4 TABLETS BY MOUTH EVERY DAY, Disp: 360 tablet, Rfl: 3 .  rosuvastatin (CRESTOR) 10 MG tablet, TAKE 1 TABLET BY MOUTH EVERY DAY, Disp: 30 tablet, Rfl: 3 .  Lorcaserin HCl 10 MG TABS, Take 10 mg by mouth 2 (two) times daily., Disp: 60 tablet, Rfl: 2   Allergies  Allergen Reactions  . Penicillins Hives    Has patient had a PCN reaction causing immediate rash, facial/tongue/throat swelling, SOB or lightheadedness with hypotension: no Has patient had a PCN reaction causing severe rash involving mucus membranes or skin necrosis: no Has patient had a PCN reaction that required hospitalization  no Has patient had a PCN reaction occurring within the last 10 years: yes If all of the above answers are "NO", then may proceed with Cephalosporin use.  . Sulfa Antibiotics Other (See Comments)    unsure    Review of Systems   ROS  Negative unless otherwise specified per HPI.   Vitals:   Vitals:   11/24/17 0827 11/24/17 0846  BP: (!) 156/100 (!) 160/100  Pulse: 82 85  Temp: 98.3 F (36.8 C)   TempSrc: Oral   SpO2: 97%   Weight: (!) 336 lb (152.4 kg)   Height: _0  (1.88 m)      Body mass index is 43.14 kg/m.   Physical Exam:    Physical Exam  Constitutional: He appears well-developed. He is cooperative.  Non-toxic appearance. He does not have a sickly appearance. He does not appear ill. No distress.  Cardiovascular: Normal rate, regular rhythm, S1 normal, S2 normal, normal heart sounds and normal pulses.  No LE edema  Pulmonary/Chest: Effort normal and breath sounds normal.  Neurological: He is alert. GCS eye subscore is 4. GCS verbal subscore is 5. GCS motor subscore is 6.  Skin: Skin is warm, dry and intact.  Psychiatric: He has a normal mood and affect. His speech is normal and behavior is normal.  Nursing note and vitals reviewed.    Assessment and Plan:    Problem List Items Addressed This Visit      Cardiovascular and Mediastinum   Benign essential hypertension - Primary    Readings in our office are elevated, however he endorses no symptoms and that home blood pressure readings are normal.  I recommended that he continue to check his blood pressures regularly, and also purchase an arm cuff instead of a wrist cuff.  Follow-up if symptoms develop or if he has numbers greater than 140/90 consistently.  Otherwise we will revisit at follow-up in 3 months.  Consider increasing lisinopril versus changing to Prinzide.        Other   Morbid obesity ([place])    Long discussion with Dr. Briscoe Deutscher  and patient regarding choices for treatment.  Trulicity is not  working for patient -- will discontinue at this time.  Due to hypertension will avoid phentermine and qsymia.  Due to GI history will avoid metformin and orlistat.  Today's visit I offered patient 3 options: Initiation of Belviq, consult with Dr. Dennard Nip for medical supervised diet, or referral for bariatric surgery consult.  Patient has opted for initiation of Belviq.  We are going to follow-up in 3 months, sooner if needed.  He is not comfortable with discussing bariatric surgery at this time.      Relevant Medications   Lorcaserin HCl 10 MG TABS   Mixed hyperlipidemia    Continue Crestor 10 mg.  Discussion with patient and Dr. Briscoe Deutscher regarding benefits versus risks of continuing medication and slight increase in LFTs.  We will continue medication at this time.  I did advise patient to follow-up with his GI physician as recommended, and when he does follow-up with him I recommended that he discuss his elevated liver enzymes to see if there is any further recommendations.      LFTs abnormal    Continue to monitor.  Suspect fatty liver.  I recommended that he discuss this with his GI doctor when he follows up.  Patient verbalized understanding and is agreeable to plan.          . Reviewed expectations re: course of current medical issues. . Discussed self-management of symptoms. . Outlined signs and symptoms indicating need for more acute intervention. . Patient verbalized understanding and all questions were answered. . See orders for this visit as documented in the electronic medical record. . Patient received an After Visit Summary.  CMA or LPN served as scribe during this visit. History, Physical, and Plan performed by medical provider. Documentation and orders reviewed and attested to.  Inda Coke, PA-C [place], [place] 11/24/2017  Follow-up: Return in about 3 months (around 02/24/2018) for Medication F/U.

## 2017-11-24 NOTE — Assessment & Plan Note (Signed)
Readings in our office are elevated, however he endorses no symptoms and that home blood pressure readings are normal.  I recommended that he continue to check his blood pressures regularly, and also purchase an arm cuff instead of a wrist cuff.  Follow-up if symptoms develop or if he has numbers greater than 140/90 consistently.  Otherwise we will revisit at follow-up in 3 months.  Consider increasing lisinopril versus changing to Prinzide.

## 2017-11-24 NOTE — Assessment & Plan Note (Signed)
Continue to monitor.  Suspect fatty liver.  I recommended that he discuss this with his GI doctor when he follows up.  Patient verbalized understanding and is agreeable to plan.

## 2017-11-24 NOTE — Patient Instructions (Addendum)
It was great to see you.  Please call Dr. Ardis Hughs in GI and get a follow-up appointment.  Stop Trulicity.  Start Belviq -- 28m twice daily.  Follow-up in 3 months.  Lorcaserin oral tablets What is this medicine? LORCASERIN (lor ca SER in) is used to promote and maintain weight loss in obese patients. This medicine should be used with a reduced calorie diet and, if appropriate, an exercise program. This medicine may be used for other purposes; ask your health care provider or pharmacist if you have questions. COMMON BRAND NAME(S): Belviq What should I tell my health care provider before I take this medicine? They need to know if you have any of these conditions: -anatomical deformation of the penis, Peyronie's disease, or history of priapism (painful and prolonged erection) -diabetes -heart disease -history of blood diseases, like sickle cell anemia or leukemia -history of irregular heartbeat -kidney disease -liver disease -suicidal thoughts, plans, or attempt; a previous suicide attempt by you or a family member -an unusual or allergic reaction to lorcaserin, other medicines, foods, dyes, or preservatives -pregnant or trying to get pregnant -breast-feeding How should I use this medicine? Take this medicine by mouth with a glass of water. Follow the directions on the prescription label. You can take it with or without food. Take your medicine at regular intervals. Do not take it more often than directed. Do not stop taking except on your doctor's advice. Talk to your pediatrician regarding the use of this medicine in children. Special care may be needed. Overdosage: If you think you have taken too much of this medicine contact a poison control center or emergency room at once. NOTE: This medicine is only for you. Do not share this medicine with others. What if I miss a dose? If you miss a dose, take it as soon as you can. If it is almost time for your next dose, take only that dose.  Do not take double or extra doses. What may interact with this medicine? -cabergoline -certain medicines for depression, anxiety, or psychotic disturbances -certain medicines for erectile dysfunction -certain medicines for migraine headache like almotriptan, eletriptan, frovatriptan, naratriptan, rizatriptan, sumatriptan, zolmitriptan -dextromethorphan -linezolid -lithium -medicines for diabetes -other weight loss products -tramadol -St. John's Wort -stimulant medicines for attention disorders, weight loss, or to stay awake -tryptophan This list may not describe all possible interactions. Give your health care provider a list of all the medicines, herbs, non-prescription drugs, or dietary supplements you use. Also tell them if you smoke, drink alcohol, or use illegal drugs. Some items may interact with your medicine. What should I watch for while using this medicine? This medicine is intended to be used in addition to a healthy diet and appropriate exercise. The best results are achieved this way. Your doctor should instruct you to stop taking this medicine if you do not lose a certain amount of weight within the first 12 weeks of treatment, but it is important that you do not change your dose in any way without consulting your doctor or health care professional. Visit your doctor or health care professional for regular checkups. Your doctor may order blood tests or other tests to see how you are doing. Do not drive, use machinery, or do anything that needs mental alertness until you know how this medicine affects you. This medicine may affect blood sugar levels. If you have diabetes, check with your doctor or health care professional before you change your diet or the dose of your diabetic medicine. Patients  and their families should watch out for worsening depression or thoughts of suicide. Also watch out for sudden changes in feelings such as feeling anxious, agitated, panicky, irritable,  hostile, aggressive, impulsive, severely restless, overly excited and hyperactive, or not being able to sleep. If this happens, especially at the beginning of treatment or after a change in dose, call your health care professional. Contact your doctor or health care professional right away if you are a man with an erection that lasts longer than 4 hours or if the erection becomes painful. This may be a sign of serious problem and must be treated right away to prevent permanent damage. What side effects may I notice from receiving this medicine? Side effects that you should report to your doctor or health care professional as soon as possible: -allergic reactions like skin rash, itching or hives, swelling of the face, lips, or tongue -abnormal production of milk -breast enlargement in both males and females -breathing problems -changes in emotions or moods -changes in vision -confusion -erection lasting more than 4 hours or a painful erection -fast or irregular heart beat -feeling faint or lightheaded, falls -fever or chills, sore throat -hallucination, loss of contact with reality -high or low blood pressure -menstrual changes -restlessness -slow or irregular heartbeat -stiff muscles -sweating -suicidal thoughts or other mood changes -swelling of the ankles, feet, hands -unusually weak or tired -vomiting Side effects that usually do not require medical attention (report to your doctor or health care professional if they continue or are bothersome): -back pain -constipation -cough -dry mouth -nausea -tiredness This list may not describe all possible side effects. Call your doctor for medical advice about side effects. You may report side effects to FDA at 1-800-FDA-1088. Where should I keep my medicine? Keep out of the reach of children. This medicine can be abused. Keep your medicine in a safe place to protect it from theft. Do not share this medicine with anyone. Selling or giving  away this medicine is dangerous and against the law. Store at room temperature between 15 and 30 degrees C (59 and 86 degrees F). Throw away any unused medicine after the expiration date. NOTE: This sheet is a summary. It may not cover all possible information. If you have questions about this medicine, talk to your doctor, pharmacist, or health care provider.  2018 Elsevier/Gold Standard (2015-08-03 12:13:31)

## 2017-11-24 NOTE — Assessment & Plan Note (Signed)
Continue Crestor 10 mg.  Discussion with patient and Dr. Briscoe Deutscher regarding benefits versus risks of continuing medication and slight increase in LFTs.  We will continue medication at this time.  I did advise patient to follow-up with his GI physician as recommended, and when he does follow-up with him I recommended that he discuss his elevated liver enzymes to see if there is any further recommendations.

## 2017-11-27 ENCOUNTER — Encounter: Payer: Self-pay | Admitting: Physician Assistant

## 2017-12-17 ENCOUNTER — Ambulatory Visit: Payer: 59 | Admitting: Physician Assistant

## 2017-12-18 ENCOUNTER — Ambulatory Visit: Payer: Commercial Managed Care - PPO | Admitting: Physician Assistant

## 2018-01-01 ENCOUNTER — Encounter: Payer: Self-pay | Admitting: Physician Assistant

## 2018-01-01 ENCOUNTER — Ambulatory Visit (INDEPENDENT_AMBULATORY_CARE_PROVIDER_SITE_OTHER): Payer: Commercial Managed Care - PPO | Admitting: Physician Assistant

## 2018-01-01 VITALS — BP 130/82 | HR 76 | Ht 74.0 in | Wt 331.0 lb

## 2018-01-01 DIAGNOSIS — R7989 Other specified abnormal findings of blood chemistry: Secondary | ICD-10-CM

## 2018-01-01 DIAGNOSIS — R945 Abnormal results of liver function studies: Secondary | ICD-10-CM | POA: Diagnosis not present

## 2018-01-01 DIAGNOSIS — K51919 Ulcerative colitis, unspecified with unspecified complications: Secondary | ICD-10-CM

## 2018-01-01 NOTE — Progress Notes (Signed)
Chief Complaint: Follow up Ulcerative Colitis  Review of pertinent gastrointestinal problems: 1. Extensive ulcerative colitis: diagnosed when admitted with GI bleeding 05/22/16 through 05/24/16, colonoscopy showed normal ileum, severe inflammation from rectum to cecum Pathology revealed chronic active ulcerative colitis throughout the entire colon. Patient was started on Prednisone 20 mg twice a day and Lialda 4 pills daily. He responded very well, tapered easily off the prednisone.  HPI:    [name] is a 32 year old male with a past medical history as listed below including ulcerative colitis for which she follows with Dr. Ardis Hughs and presents to clinic today for a yearly follow-up.      01/03/2017 office visit reported back to normal 1-2 soft formed bowel movements daily nonbloody.  He was taking his Lialda 4 pills once daily.     12/01/2017 CMP with an AST of 43 (39 6 months ago), ALT 86 (73 6 months ago).    Today, explains that for the past 1 to 2 weeks he has been seeing a slight increase in bowel movements noting that he will sometimes have a soft solid stool in the morning and occasionally one after lunch.  These have also been "maybe a little looser" than normal.  Describes some very mild stomach "upset".  Does admit to using Goody's and Ibuprofen about 2 weeks ago related to some jaw/tooth pain.  This does correlate with when his symptoms began.  Has continued Lialda 4 tabs daily.    Also describes that his liver enzymes have been elevated at the last check in May, his PCP is concerned regarding this and his Lialda.  They also recently started him on Crestor for some elevated lipids and said this may interact with his Lialda.  He was supposed to ask about this today.  They are willing to stop his Crestor if needed.    Denies fever, chills, blood in his stool, melena, weight loss, anorexia, nausea, vomiting or symptoms that awaken him at night.  Past Medical History:  Diagnosis Date  .  Hypertension   . Panic anxiety syndrome     Past Surgical History:  Procedure Laterality Date  . COLONOSCOPY N/A 05/24/2016   Procedure: COLONOSCOPY;  Surgeon: [name], MD;  Location: [facility];  Service: Endoscopy;  Laterality: N/A;  . ESOPHAGOGASTRODUODENOSCOPY N/A 05/24/2016   Procedure: ESOPHAGOGASTRODUODENOSCOPY (EGD);  Surgeon: [name], MD;  Location: [facility];  Service: Endoscopy;  Laterality: N/A;  . LUMBAR LAMINECTOMY/DECOMPRESSION MICRODISCECTOMY N/A 02/14/2016   Procedure: MICRO  LUMBER L4-5;  Surgeon: Susa Day, MD;  Location: [facility];  Service: Orthopedics;  Laterality: N/A;  . NO PAST SURGERIES      Current Outpatient Medications  Medication Sig Dispense Refill  . escitalopram (LEXAPRO) 10 MG tablet Take 1 tablet (10 mg total) by mouth daily. 30 tablet 4  . lisinopril (PRINIVIL,ZESTRIL) 20 MG tablet Take 1 tablet (20 mg total) by mouth daily. 30 tablet 4  . mesalamine (LIALDA) 1.2 g EC tablet TAKE 4 TABLETS BY MOUTH EVERY DAY 360 tablet 3  . rosuvastatin (CRESTOR) 10 MG tablet TAKE 1 TABLET BY MOUTH EVERY DAY 30 tablet 3   No current facility-administered medications for this visit.     Allergies as of 01/01/2018 - Review Complete 01/01/2018  Allergen Reaction Noted  . Penicillins Hives 02/06/2016  . Sulfa antibiotics Other (See Comments) 02/06/2016    Family History  Problem Relation Age of Onset  . Hypertension Mother   . Diabetes Mother   . Hyperlipidemia  Mother   . Hypertension Father   . Diabetes Father   . Inflammatory bowel disease Other   . Diabetes Other   . High Cholesterol Maternal Grandmother   . Hypertension Maternal Grandmother   . Heart disease Maternal Grandfather   . High Cholesterol Maternal Grandfather   . Hypertension Maternal Grandfather   . Stroke Maternal Grandfather   . Cancer Paternal Grandmother   . Diabetes Paternal Grandmother   . Heart disease Paternal Grandmother   . High Cholesterol Paternal  Grandmother   . Hypertension Paternal Grandmother   . Heart disease Paternal Grandfather   . High Cholesterol Paternal Grandfather   . Hypertension Paternal Grandfather   . Stroke Paternal Grandfather     Social History   Socioeconomic History  . Marital status: Married    Spouse name: Not on file  . Number of children: 0  . Years of education: Not on file  . Highest education level: Not on file  Occupational History  . Not on file  Social Needs  . Financial resource strain: Not on file  . Food insecurity:    Worry: Not on file    Inability: Not on file  . Transportation needs:    Medical: Not on file    Non-medical: Not on file  Tobacco Use  . Smoking status: Never Smoker  . Smokeless tobacco: Never Used  Substance and Sexual Activity  . Alcohol use: Yes    Comment: occ  . Drug use: No  . Sexual activity: Yes    Partners: Female  Lifestyle  . Physical activity:    Days per week: Not on file    Minutes per session: Not on file  . Stress: Not on file  Relationships  . Social connections:    Talks on phone: Not on file    Gets together: Not on file    Attends religious service: Not on file    Active member of club or organization: Not on file    Attends meetings of clubs or organizations: Not on file    Relationship status: Not on file  . Intimate partner violence:    Fear of current or ex partner: Not on file    Emotionally abused: Not on file    Physically abused: Not on file    Forced sexual activity: Not on file  Other Topics Concern  . 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 Eyes:   PEERL, EOMI. No icterus. Conjunctiva pink. Ears:  Normal auditory acuity. Neck:  Supple Throat: Oral cavity and pharynx without inflammation, swelling or lesion.  Respiratory: Respirations even and unlabored. Lungs clear to auscultation bilaterally.   No wheezes, crackles, or rhonchi.  Cardiovascular: Normal S1, S2. No MRG. Regular rate and rhythm. No peripheral edema, cyanosis or pallor.  Gastrointestinal:  Soft, nondistended, nontender. No rebound or guarding. Normal bowel sounds. No appreciable masses  or hepatomegaly. Rectal:  Not performed.  Msk:  Symmetrical without gross deformities. Without edema, no deformity or joint abnormality.  Neurologic:  Alert and  oriented x4;  grossly normal neurologically.  Skin:   Dry and intact without significant lesions or rashes. Psychiatric: Demonstrates good judgement and reason without abnormal affect or behaviors.  RELEVANT LABS AND IMAGING: CBC    Component Value Date/Time   WBC 6.8 06/18/2017 0838   RBC 5.31 06/18/2017 0838   HGB 15.5 06/18/2017 0838   HCT 46.4 06/18/2017 0838   PLT 318.0 06/18/2017 0838   MCV 87.4 06/18/2017 0838   MCH 28.5 05/23/2016 1142   MCHC 33.4 06/18/2017 0838   RDW 13.0 06/18/2017 0838   LYMPHSABS 2.2 06/18/2017 0838   MONOABS 0.4 06/18/2017 0838   EOSABS 0.1 06/18/2017 0838   BASOSABS 0.0 06/18/2017 0838    CMP     Component Value Date/Time   NA 138 11/21/2017 0758   K 4.6 11/21/2017 0758   CL 103 11/21/2017 0758   CO2 29 11/21/2017 0758   GLUCOSE 89 11/21/2017 0758   BUN 16 11/21/2017 0758   CREATININE 0.99 11/21/2017 0758   CALCIUM 9.3 11/21/2017 0758   PROT 6.7 11/21/2017 0758   ALBUMIN 4.4 11/21/2017 0758   AST 43 (H) 11/21/2017 0758   ALT 86 (H) 11/21/2017 0758   ALKPHOS 47 11/21/2017 0758   BILITOT 0.5 11/21/2017 0758    GFRNONAA >60 05/23/2016 0314   GFRAA >60 05/23/2016 0314    Assessment: 1.  Ulcerative colitis: Patient describes some vague change in bowel habits recently but not a "full flare", denies being uncomfortable, just wanted [place] to be aware, was using NSAIDs when this started, likely related 2.  Elevated LFTs: Over the past 6 months have been slowly increasing, Crestor was added, patient on Lialda  Plan: 1.  We will recheck LFTs in 1 month.  If they continue to increase we will need to discuss either stopping Crestor and/or changing Lialda. Patient may also need further workup including other liver serologies and abdominal imaging 2.  Recommend the patient start a daily probiotic such as Align, provided him with a coupon once daily over the next month. 3.  It does not sound as though the patient is in a full flare, likely his symptoms are from recent NSAID use.  Explained that he needs to discontinue all NSAID use in the future and only use Tylenol if needed. 4.  Patient to follow in clinic per recommendations after labs with Dr. Ardis Hughs or myself.  Ellouise Newer, PA-C [facility] 01/01/2018, 8:40 AM  Cc: Inda Coke, [place]

## 2018-01-01 NOTE — Patient Instructions (Addendum)
If you are age 33 or older, your body mass index should be between 23-30. Your Body mass index is 42.5 kg/m. If this is out of the aforementioned range listed, please consider follow up with your Primary Care Provider.  If you are age 19 or younger, your body mass index should be between 19-25. Your Body mass index is 42.5 kg/m. If this is out of the aformentioned range listed, please consider follow up with your Primary Care Provider.   You provider has requested that you go to the basement level for lab work in one month Press "B" on the elevator. The lab is located at the first door on the left as you exit the elevator. CBC w/Diff CMET  NO NSAIDS.  Start Align for one month - coupons given.  Thank you for choosing me and [facility].   Ellouise Newer, PA-C

## 2018-01-01 NOTE — Progress Notes (Signed)
I agree with the above note, plan 

## 2018-02-24 ENCOUNTER — Ambulatory Visit: Payer: Commercial Managed Care - PPO | Admitting: Physician Assistant

## 2018-03-08 ENCOUNTER — Other Ambulatory Visit: Payer: Self-pay | Admitting: Family Medicine

## 2018-03-08 DIAGNOSIS — E782 Mixed hyperlipidemia: Secondary | ICD-10-CM

## 2018-04-11 ENCOUNTER — Other Ambulatory Visit: Payer: Self-pay | Admitting: Family Medicine

## 2018-04-11 DIAGNOSIS — F419 Anxiety disorder, unspecified: Secondary | ICD-10-CM

## 2018-04-13 NOTE — Telephone Encounter (Signed)
[place] patient.

## 2018-05-06 ENCOUNTER — Other Ambulatory Visit: Payer: Self-pay | Admitting: Family Medicine

## 2018-05-06 DIAGNOSIS — I10 Essential (primary) hypertension: Secondary | ICD-10-CM

## 2018-05-06 NOTE — Telephone Encounter (Signed)
Please advise on refill. Patient canceled 3 month follow up in 8/19. No follow up scheduled.

## 2018-06-03 ENCOUNTER — Other Ambulatory Visit: Payer: Self-pay | Admitting: Physician Assistant

## 2018-06-03 DIAGNOSIS — I10 Essential (primary) hypertension: Secondary | ICD-10-CM

## 2018-06-07 ENCOUNTER — Other Ambulatory Visit: Payer: Self-pay | Admitting: Gastroenterology

## 2018-06-09 ENCOUNTER — Other Ambulatory Visit (INDEPENDENT_AMBULATORY_CARE_PROVIDER_SITE_OTHER): Payer: Commercial Managed Care - PPO

## 2018-06-09 ENCOUNTER — Encounter: Payer: Self-pay | Admitting: Gastroenterology

## 2018-06-09 ENCOUNTER — Ambulatory Visit (INDEPENDENT_AMBULATORY_CARE_PROVIDER_SITE_OTHER): Payer: Commercial Managed Care - PPO | Admitting: Gastroenterology

## 2018-06-09 VITALS — BP 142/88 | HR 96 | Ht 74.0 in | Wt 338.2 lb

## 2018-06-09 DIAGNOSIS — R7989 Other specified abnormal findings of blood chemistry: Secondary | ICD-10-CM

## 2018-06-09 DIAGNOSIS — R945 Abnormal results of liver function studies: Secondary | ICD-10-CM

## 2018-06-09 DIAGNOSIS — K51919 Ulcerative colitis, unspecified with unspecified complications: Secondary | ICD-10-CM | POA: Diagnosis not present

## 2018-06-09 LAB — COMPREHENSIVE METABOLIC PANEL
ALT: 94 U/L — AB (ref 0–53)
AST: 45 U/L — ABNORMAL HIGH (ref 0–37)
Albumin: 4.6 g/dL (ref 3.5–5.2)
Alkaline Phosphatase: 63 U/L (ref 39–117)
BILIRUBIN TOTAL: 0.4 mg/dL (ref 0.2–1.2)
BUN: 12 mg/dL (ref 6–23)
CALCIUM: 9.8 mg/dL (ref 8.4–10.5)
CO2: 27 mEq/L (ref 19–32)
Chloride: 103 mEq/L (ref 96–112)
Creatinine, Ser: 0.92 mg/dL (ref 0.40–1.50)
GFR: 100.47 mL/min (ref >60.00)
Glucose, Bld: 103 mg/dL — ABNORMAL HIGH (ref 70–99)
POTASSIUM: 3.8 meq/L (ref 3.5–5.1)
Sodium: 138 mEq/L (ref 135–145)
TOTAL PROTEIN: 7.4 g/dL (ref 6.0–8.3)

## 2018-06-09 LAB — CBC WITH DIFFERENTIAL/PLATELET
BASOS ABS: 0 10*3/uL (ref 0.0–0.1)
Basophils Relative: 0.3 % (ref 0.0–3.0)
EOS PCT: 1.1 % (ref 0.0–5.0)
Eosinophils Absolute: 0.1 10*3/uL (ref 0.0–0.7)
HCT: 44.1 % (ref 39.0–52.0)
HEMOGLOBIN: 15 g/dL (ref 13.0–17.0)
LYMPHS PCT: 39.1 % (ref 12.0–46.0)
Lymphs Abs: 3.8 10*3/uL (ref 0.7–4.0)
MCHC: 34.1 g/dL (ref 30.0–36.0)
MCV: 85.8 fl (ref 78.0–100.0)
MONOS PCT: 5.6 % (ref 3.0–12.0)
Monocytes Absolute: 0.5 10*3/uL (ref 0.1–1.0)
NEUTROS PCT: 53.9 % (ref 43.0–77.0)
Neutro Abs: 5.2 10*3/uL (ref 1.4–7.7)
Platelets: 335 10*3/uL (ref 150.0–400.0)
RBC: 5.14 Mil/uL (ref 4.22–5.81)
RDW: 12.7 % (ref 11.5–15.5)
WBC: 9.7 10*3/uL (ref 4.0–10.5)

## 2018-06-09 MED ORDER — MESALAMINE 1.2 G PO TBEC: 4.8000 g | DELAYED_RELEASE_TABLET | Freq: Every day | ORAL | 11 refills | Status: DC

## 2018-06-09 NOTE — Progress Notes (Signed)
Review of pertinent gastrointestinal problems: 1. Extensive ulcerative colitis:terrible bloody diarrhea, abd pains, nausea/vomting.  diagnosed when admitted with GI bleeding 11/8/17through 05/24/16, colonoscopy showednormal ileum, severe inflammation from rectum to cecum Pathology revealed chronic active ulcerative colitis throughout the entire colon. Patient was started on Prednisone 20 mg twice a day and Lialda4 pills daily. He responded very well, tapered easily off the prednisone.  HPI: This is a very pleasant 32 year old man whom I last saw about a year ago.  He was here more recently and he met with Anderson Malta.  That was about 5 months ago.  He was having some irregular, looser than usual bowels.  No obvious bleeding.  It was not clear if he was having a flare.  He was certainly taking a lot of Goody powders around that time  ? Flare improved after a few days. He had been taking a lot of goody's.  He switched to tyelnol.  His symptoms just resolved.  Lately no bleeding. No abd pains.  Bid Stools, solid brown  Chief complaint is ulcerative colitis,  ROS: complete GI ROS as described in HPI, all other review negative.  Constitutional:  No unintentional weight loss   Past Medical History:  Diagnosis Date  . Hypertension   . Panic anxiety syndrome     Past Surgical History:  Procedure Laterality Date  . COLONOSCOPY N/A 05/24/2016   Procedure: COLONOSCOPY;  Surgeon: [name], MD;  Location: [facility];  Service: Endoscopy;  Laterality: N/A;  . ESOPHAGOGASTRODUODENOSCOPY N/A 05/24/2016   Procedure: ESOPHAGOGASTRODUODENOSCOPY (EGD);  Surgeon: [name], MD;  Location: [facility];  Service: Endoscopy;  Laterality: N/A;  . LUMBAR LAMINECTOMY/DECOMPRESSION MICRODISCECTOMY N/A 02/14/2016   Procedure: MICRO  LUMBER L4-5;  Surgeon: Susa Day, MD;  Location: [facility];  Service: Orthopedics;  Laterality: N/A;  . NO PAST SURGERIES      Current Outpatient Medications   Medication Sig Dispense Refill  . escitalopram (LEXAPRO) 10 MG tablet TAKE 1 TABLET BY MOUTH EVERY DAY 30 tablet 1  . lisinopril (PRINIVIL,ZESTRIL) 20 MG tablet TAKE 1 TABLET BY MOUTH EVERY DAY 30 tablet 0  . mesalamine (LIALDA) 1.2 g EC tablet TAKE 4 TABLETS BY MOUTH EVERY DAY 120 tablet 0  . rosuvastatin (CRESTOR) 10 MG tablet TAKE 1 TABLET BY MOUTH EVERY DAY 30 tablet 3   No current facility-administered medications for this visit.     Allergies as of 06/09/2018 - Review Complete 06/09/2018  Allergen Reaction Noted  . Penicillins Hives 02/06/2016  . Sulfa antibiotics Other (See Comments) 02/06/2016    Family History  Problem Relation Age of Onset  . Hypertension Mother   . Diabetes Mother   . Hyperlipidemia Mother   . Hypertension Father   . Diabetes Father   . Inflammatory bowel disease Other   . Diabetes Other   . High Cholesterol Maternal Grandmother   . Hypertension Maternal Grandmother   . Heart disease Maternal Grandfather   . High Cholesterol Maternal Grandfather   . Hypertension Maternal Grandfather   . Stroke Maternal Grandfather   . Cancer Paternal Grandmother   . Diabetes Paternal Grandmother   . Heart disease Paternal Grandmother   . High Cholesterol Paternal Grandmother   . Hypertension Paternal Grandmother   . Heart disease Paternal Grandfather   . High Cholesterol Paternal Grandfather   . Hypertension Paternal Grandfather   . Stroke Paternal Grandfather     Social History   Socioeconomic History  . Marital status: Married    Spouse name:  Not on file  . Number of children: 0  . Years of education: Not on file  . Highest education level: Not on file  Occupational History  . Not on file  Social Needs  . Financial resource strain: Not on file  . Food insecurity:    Worry: Not on file    Inability: Not on file  . Transportation needs:    Medical: Not on file    Non-medical: Not on file  Tobacco Use  . Smoking status: Never Smoker  .  Smokeless tobacco: Never Used  Substance and Sexual Activity  . Alcohol use: Yes    Comment: occ  . Drug use: No  . Sexual activity: Yes    Partners: Female  Lifestyle  . Physical activity:    Days per week: Not on file    Minutes per session: Not on file  . Stress: Not on file  Relationships  . Social connections:    Talks on phone: Not on file    Gets together: Not on file    Attends religious service: Not on file    Active member of club or organization: Not on file    Attends meetings of clubs or organizations: Not on file    Relationship status: Not on file  . Intimate partner violence:    Fear of current or ex partner: Not on file    Emotionally abused: Not on file    Physically abused: Not on file    Forced sexual activity: Not on file  Other Topics Concern  . 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 male with extensive ulcerative colitis  He is disease is clinically under very good control on full strength oral mesalamine.  He had labs checked just before this visit, CBC and complete metabolic profile.  We will contact him when those are back.  I refilled his Lialda 4 pills once daily.  He will return to see me in 6 months and sooner if needed.  Please see the "Patient Instructions" section for addition details about the plan.  [name], MD [facility] 06/09/2018, 2:03 PM

## 2018-06-09 NOTE — Patient Instructions (Addendum)
lialda 1.2gm pills, 4 pills once daily, disp 1 month with 11 refills. Please return to see Dr. Ardis Hughs in 6 months, sooner if needed.  Await lab test results from today.  Thank you for entrusting me with your care and choosing [facility].  Dr Ardis Hughs

## 2018-06-10 ENCOUNTER — Other Ambulatory Visit: Payer: Self-pay | Admitting: Gastroenterology

## 2018-06-10 ENCOUNTER — Other Ambulatory Visit: Payer: Self-pay | Admitting: Physician Assistant

## 2018-06-10 ENCOUNTER — Telehealth: Payer: Self-pay

## 2018-06-10 DIAGNOSIS — F419 Anxiety disorder, unspecified: Secondary | ICD-10-CM

## 2018-06-10 DIAGNOSIS — R7989 Other specified abnormal findings of blood chemistry: Secondary | ICD-10-CM

## 2018-06-10 DIAGNOSIS — R945 Abnormal results of liver function studies: Principal | ICD-10-CM

## 2018-06-10 NOTE — Telephone Encounter (Signed)
Spoke to patient. Informed him of recent labs and Dr Ardis Hughs recommendations due to elevated alt. Patient has been scheduled for an ultrasound of his liver on 06/17/18 at [facility], he will come by the office afterwards for blood work. Patient voiced understanding. [place] instructions mailed to patient.

## 2018-06-17 ENCOUNTER — Other Ambulatory Visit (INDEPENDENT_AMBULATORY_CARE_PROVIDER_SITE_OTHER): Payer: Commercial Managed Care - PPO

## 2018-06-17 ENCOUNTER — Ambulatory Visit (HOSPITAL_COMMUNITY)
Admission: RE | Admit: 2018-06-17 | Discharge: 2018-06-17 | Disposition: A | Discharge status: Home / Self Care | Payer: Commercial Managed Care - PPO | Source: Ambulatory Visit | Attending: Gastroenterology | Admitting: Gastroenterology

## 2018-06-17 DIAGNOSIS — R945 Abnormal results of liver function studies: Secondary | ICD-10-CM

## 2018-06-17 DIAGNOSIS — R7989 Other specified abnormal findings of blood chemistry: Secondary | ICD-10-CM

## 2018-06-17 DIAGNOSIS — K7689 Other specified diseases of liver: Secondary | ICD-10-CM | POA: Diagnosis not present

## 2018-06-17 LAB — IGA: IgA: 249 mg/dL (ref 68–378)

## 2018-06-17 LAB — COMPREHENSIVE METABOLIC PANEL
ALT: 76 U/L — ABNORMAL HIGH (ref 0–53)
AST: 38 U/L — ABNORMAL HIGH (ref 0–37)
Albumin: 4.7 g/dL (ref 3.5–5.2)
Alkaline Phosphatase: 66 U/L (ref 39–117)
BUN: 14 mg/dL (ref 6–23)
CO2: 24 mEq/L (ref 19–32)
Calcium: 9.7 mg/dL (ref 8.4–10.5)
Chloride: 104 mEq/L (ref 96–112)
Creatinine, Ser: 0.95 mg/dL (ref 0.40–1.50)
GFR: 96.81 mL/min (ref >60.00)
Glucose, Bld: 115 mg/dL — ABNORMAL HIGH (ref 70–99)
POTASSIUM: 4.2 meq/L (ref 3.5–5.1)
Sodium: 138 mEq/L (ref 135–145)
Total Bilirubin: 0.6 mg/dL (ref 0.2–1.2)
Total Protein: 7.6 g/dL (ref 6.0–8.3)

## 2018-06-17 LAB — CBC WITH DIFFERENTIAL/PLATELET
Basophils Absolute: 0 10*3/uL (ref 0.0–0.1)
Basophils Relative: 0.2 % (ref 0.0–3.0)
EOS PCT: 1.5 % (ref 0.0–5.0)
Eosinophils Absolute: 0.1 10*3/uL (ref 0.0–0.7)
HEMATOCRIT: 45.8 % (ref 39.0–52.0)
Hemoglobin: 15.7 g/dL (ref 13.0–17.0)
LYMPHS PCT: 36.1 % (ref 12.0–46.0)
Lymphs Abs: 2.7 10*3/uL (ref 0.7–4.0)
MCHC: 34.4 g/dL (ref 30.0–36.0)
MCV: 85.7 fl (ref 78.0–100.0)
Monocytes Absolute: 0.4 10*3/uL (ref 0.1–1.0)
Monocytes Relative: 5.3 % (ref 3.0–12.0)
Neutro Abs: 4.3 10*3/uL (ref 1.4–7.7)
Neutrophils Relative %: 56.9 % (ref 43.0–77.0)
Platelets: 295 10*3/uL (ref 150.0–400.0)
RBC: 5.34 Mil/uL (ref 4.22–5.81)
RDW: 12.9 % (ref 11.5–15.5)
WBC: 7.5 10*3/uL (ref 4.0–10.5)

## 2018-06-17 LAB — FERRITIN: Ferritin: 102.2 ng/mL (ref 22.0–322.0)

## 2018-06-17 LAB — PROTIME-INR
INR: 1 ratio (ref 0.8–1.0)
Prothrombin Time: 11.3 s (ref 9.6–13.1)

## 2018-06-17 LAB — IRON: IRON: 129 ug/dL (ref 42–165)

## 2018-06-17 NOTE — Addendum Note (Signed)
Addended by: Trenda Moots on: 20/09/5595 08:23 AM   Modules accepted: Orders

## 2018-06-18 LAB — IRON AND TIBC
IRON SATURATION: 40 % (ref 15–55)
IRON: 124 ug/dL (ref 38–169)
TIBC: 307 ug/dL (ref 250–450)
UIBC: 183 ug/dL (ref 111–343)

## 2018-06-22 ENCOUNTER — Other Ambulatory Visit: Payer: Self-pay

## 2018-06-22 DIAGNOSIS — R945 Abnormal results of liver function studies: Principal | ICD-10-CM

## 2018-06-22 DIAGNOSIS — R7989 Other specified abnormal findings of blood chemistry: Secondary | ICD-10-CM

## 2018-06-22 LAB — HEPATITIS C ANTIBODY
Hepatitis C Ab: NONREACTIVE
SIGNAL TO CUT-OFF: 0.01 (ref <1.00)

## 2018-06-22 LAB — HEPATITIS B SURFACE ANTIBODY,QUALITATIVE: Hep B S Ab: NONREACTIVE

## 2018-06-22 LAB — CERULOPLASMIN: Ceruloplasmin: 27 mg/dL (ref 18–36)

## 2018-06-22 LAB — HEPATITIS A ANTIBODY, TOTAL: HEPATITIS A AB,TOTAL: NONREACTIVE

## 2018-06-22 LAB — TISSUE TRANSGLUTAMINASE, IGA: (tTG) Ab, IgA: 1 U/mL

## 2018-06-22 LAB — ALPHA-1-ANTITRYPSIN: A-1 Antitrypsin, Ser: 136 mg/dL (ref 83–199)

## 2018-06-22 LAB — MITOCHONDRIAL ANTIBODIES: Mitochondrial M2 Ab, IgG: <=20 U

## 2018-06-22 LAB — HEPATITIS B SURFACE ANTIGEN: HEP B S AG: NONREACTIVE

## 2018-06-22 LAB — ANTI-SMOOTH MUSCLE ANTIBODY, IGG: Actin (Smooth Muscle) Antibody (IGG): <20 U (ref <20)

## 2018-06-22 LAB — ANA: Anti Nuclear Antibody(ANA): NEGATIVE

## 2018-06-22 NOTE — Progress Notes (Signed)
Hepatic

## 2018-06-24 ENCOUNTER — Other Ambulatory Visit: Payer: Self-pay | Admitting: Physician Assistant

## 2018-06-24 DIAGNOSIS — F419 Anxiety disorder, unspecified: Secondary | ICD-10-CM

## 2018-06-24 DIAGNOSIS — I10 Essential (primary) hypertension: Secondary | ICD-10-CM

## 2018-07-15 HISTORY — PX: LASIK: SHX215

## 2018-08-05 ENCOUNTER — Encounter: Payer: Self-pay | Admitting: Physician Assistant

## 2018-08-05 ENCOUNTER — Ambulatory Visit (INDEPENDENT_AMBULATORY_CARE_PROVIDER_SITE_OTHER): Payer: Commercial Managed Care - PPO | Admitting: Physician Assistant

## 2018-08-05 DIAGNOSIS — F419 Anxiety disorder, unspecified: Secondary | ICD-10-CM

## 2018-08-05 DIAGNOSIS — I10 Essential (primary) hypertension: Secondary | ICD-10-CM

## 2018-08-05 MED ORDER — LISINOPRIL 20 MG PO TABS: 20.0000 mg | ORAL_TABLET | Freq: Every day | ORAL | 1 refills | Status: DC

## 2018-08-05 MED ORDER — ESCITALOPRAM OXALATE 10 MG PO TABS: 10.0000 mg | ORAL_TABLET | Freq: Every day | ORAL | 1 refills | Status: DC

## 2018-08-05 NOTE — Patient Instructions (Signed)
It was great to see you!  We will call you when we have some samples of Saxenda -- look into this and see if this something that you would like to do in the meantime.  Let's follow-up in 6 months, sooner if you have concerns.  Take care,  Inda Coke PA-C

## 2018-08-05 NOTE — Progress Notes (Signed)
[name] is a 33 y.o. male is here to follow up on Hypertension and Anxiety.  I acted as a Education administrator for [employer], PA-C Anselmo Pickler, LPN  History of Present Illness:   Chief Complaint  Patient presents with  . Hypertension  . Anxiety    Hypertension  This is a chronic problem. The current episode started more than 1 year ago (Pt here for follow up on blood pressure. Pt has been taking Bp daily , Systolic ranging 702-637, Diastolic 85-88.). The problem has been gradually improving since onset. The problem is uncontrolled. Associated symptoms include anxiety. Pertinent negatives include no blurred vision, chest pain, headaches, malaise/fatigue, palpitations, peripheral edema or shortness of breath. Risk factors for coronary artery disease include obesity, male gender and dyslipidemia. There are no compliance problems.    Obesity Continues to struggle with weight gain. Has tried Trulicity with little success. And has tried to get Belviq but it was too expensive. Is attempting to work on diet and exercise. Wt Readings from Last 5 Encounters:  08/05/18 (!) 341 lb (154.7 kg)  06/09/18 (!) 338 lb 4 oz (153.4 kg)  01/01/18 (!) 331 lb (150.1 kg)  11/24/17 (!) 336 lb (152.4 kg)  08/25/17 (!) 333 lb 6.4 oz (151.2 kg)   Anxiety He is doing well on Lexapro 10 mg. It is helping his sensation of panic and agoraphobia. He has some recent increased stressors in his life, however he doesn't think that he needs a medication change at this time.    Health Maintenance Due  Topic Date Due  . HIV Screening  01/25/2000    Past Medical History:  Diagnosis Date  . Hypertension   . Panic anxiety syndrome      Social History   Socioeconomic History  . Marital status: Married    Spouse name: Not on file  . Number of children: 0  . Years of education: Not on file  . Highest education level: Not on file  Occupational History  . Not on file  Social Needs  . Financial  resource strain: Not on file  . Food insecurity:    Worry: Not on file    Inability: Not on file  . Transportation needs:    Medical: Not on file    Non-medical: Not on file  Tobacco Use  . Smoking status: Never Smoker  . Smokeless tobacco: Never Used  Substance and Sexual Activity  . Alcohol use: Yes    Comment: occ  . Drug use: No  . Sexual activity: Yes    Partners: Female  Lifestyle  . Physical activity:    Days per week: Not on file    Minutes per session: Not on file  . Stress: Not on file  Relationships  . Social connections:    Talks on phone: Not on file    Gets together: Not on file    Attends religious service: Not on file    Active member of club or organization: Not on file    Attends meetings of clubs or organizations: Not on file    Relationship status: Not on file  . Intimate partner violence:    Fear of current or ex partner: Not on file    Emotionally abused: Not on file    Physically abused: Not on file    Forced sexual activity: Not on file  Other Topics Concern  . 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 COLONOSCOPY N/A 05/24/2016   Procedure: COLONOSCOPY;  Surgeon: [name], MD;  Location: [facility];  Service: Endoscopy;  Laterality: N/A;  . ESOPHAGOGASTRODUODENOSCOPY N/A 05/24/2016   Procedure: ESOPHAGOGASTRODUODENOSCOPY (EGD);  Surgeon: [name], MD;  Location: [facility];  Service: Endoscopy;  Laterality: N/A;  . LUMBAR LAMINECTOMY/DECOMPRESSION MICRODISCECTOMY N/A 02/14/2016   Procedure: MICRO  LUMBER L4-5;  Surgeon: Susa Day, MD;  Location: [facility];  Service: Orthopedics;  Laterality: N/A;  . NO PAST SURGERIES      Family History  Problem Relation Age of Onset  . Hypertension Mother   . Diabetes Mother   . Hyperlipidemia Mother   . Hypertension Father   . Diabetes Father   .  Inflammatory bowel disease Other   . Diabetes Other   . High Cholesterol Maternal Grandmother   . Hypertension Maternal Grandmother   . Heart disease Maternal Grandfather   . High Cholesterol Maternal Grandfather   . Hypertension Maternal Grandfather   . Stroke Maternal Grandfather   . Cancer Paternal Grandmother   . Diabetes Paternal Grandmother   . Heart disease Paternal Grandmother   . High Cholesterol Paternal Grandmother   . Hypertension Paternal Grandmother   . Heart disease Paternal Grandfather   . High Cholesterol Paternal Grandfather   . Hypertension Paternal Grandfather   . Stroke Paternal Grandfather     PMHx, SurgHx, SocialHx, FamHx, Medications, and Allergies were reviewed in the Visit Navigator and updated as appropriate.   Patient Active Problem List   Diagnosis Date Noted  . Mixed hyperlipidemia 11/24/2017  . LFTs abnormal 11/24/2017  . Morbid obesity ([place]) 12/12/2016  . Ulcerative pancolitis without complication ([place])   . Gastrointestinal hemorrhage 05/22/2016  . HNP (herniated nucleus pulposus), lumbar 02/14/2016  . Spinal stenosis of lumbar region 02/14/2016  . Benign essential hypertension 03/28/2015  . Panic disorder with agoraphobia and moderate panic attacks 03/28/2015  . Controlled substance agreement signed 08/09/2014    Social History   Tobacco Use  . Smoking status: Never Smoker  . Smokeless tobacco: Never Used  Substance Use Topics  . Alcohol use: Yes    Comment: occ  . Drug use: No    Current Medications and Allergies:    Current Outpatient Medications:  .  escitalopram (LEXAPRO) 10 MG tablet, Take 1 tablet (10 mg total) by mouth daily., Disp: 90 tablet, Rfl: 1 .  lisinopril (PRINIVIL,ZESTRIL) 20 MG tablet, Take 1 tablet (20 mg total) by mouth daily., Disp: 90 tablet, Rfl: 1 .  mesalamine (LIALDA) 1.2 g EC tablet, Take 4 tablets (4.8 g total) by mouth daily., Disp: 120 tablet, Rfl: 11   Allergies  Allergen Reactions  . Penicillins  Hives    Has patient had a PCN reaction causing immediate rash, facial/tongue/throat swelling, SOB or lightheadedness with hypotension: no Has patient had a PCN reaction causing severe rash involving mucus membranes or skin necrosis: no Has patient had a PCN reaction that required hospitalization no Has patient had a PCN reaction occurring within the last 10 years: yes If all of the above answers are "NO", then may proceed with Cephalosporin use.  . Sulfa Antibiotics Other (See Comments)    unsure    Review of Systems   Review of Systems  Constitutional: Negative for malaise/fatigue.  Eyes: Negative for blurred vision.  Respiratory: Negative for shortness of breath.   Cardiovascular: Negative for chest pain and  palpitations.  Neurological: Negative for headaches.    Vitals:   Vitals:   08/05/18 0802  BP: 138/86  Pulse: 92  Temp: 98.5 F (36.9 C)  TempSrc: Oral  SpO2: 98%  Weight: (!) 341 lb (154.7 kg)  Height: 6' 2"  (1.88 m)     Body mass index is 43.78 kg/m.   Physical Exam:    Physical Exam Vitals signs and nursing note reviewed.  Constitutional:      General: He is not in acute distress.    Appearance: He is well-developed. He is not ill-appearing or toxic-appearing.  Cardiovascular:     Rate and Rhythm: Normal rate and regular rhythm.     Pulses: Normal pulses.     Heart sounds: Normal heart sounds, S1 normal and S2 normal.     Comments: No LE edema Pulmonary:     Effort: Pulmonary effort is normal.     Breath sounds: Normal breath sounds.  Skin:    General: Skin is warm and dry.  Neurological:     Mental Status: He is alert.     GCS: GCS eye subscore is 4. GCS verbal subscore is 5. GCS motor subscore is 6.  Psychiatric:        Speech: Speech normal.        Behavior: Behavior normal. Behavior is cooperative.      Assessment and Plan:    Sriram was seen today for hypertension and anxiety.  Diagnoses and all orders for this visit:  Morbid  obesity [facility]) He is interested in trying Saxenda. Will alert patient once we obtain samples. May also consider Victoza if needed.  Hypertension, unspecified type Stable. Continue current plan. F/u 6 months. -     lisinopril (PRINIVIL,ZESTRIL) 20 MG tablet; Take 1 tablet (20 mg total) by mouth daily.  Anxiety Stable. Continue current plan. F/u 6 months. -     escitalopram (LEXAPRO) 10 MG tablet; Take 1 tablet (10 mg total) by mouth daily.  . Reviewed expectations re: course of current medical issues. . Discussed self-management of symptoms. . Outlined signs and symptoms indicating need for more acute intervention. . Patient verbalized understanding and all questions were answered. . See orders for this visit as documented in the electronic medical record. . Patient received an After Visit Summary.  CMA or LPN served as scribe during this visit. History, Physical, and Plan performed by medical provider. The above documentation has been reviewed and is accurate and complete.  Inda Coke, PA-C [place] [place] 08/05/2018  Follow-up: No follow-ups on file.

## 2018-09-21 ENCOUNTER — Telehealth: Payer: Self-pay

## 2018-09-21 NOTE — Telephone Encounter (Signed)
-----   Message from Marlon Pel, RN sent at 06/22/2018 12:03 PM EST ----- Needs hepatic and OV.  Lab orders in Dr. Ardis Hughs- see results from 06/22/18

## 2018-09-21 NOTE — Telephone Encounter (Signed)
Pt notified via My Chart to have labs and call to set up appt

## 2018-10-05 ENCOUNTER — Encounter: Payer: Self-pay | Admitting: Physician Assistant

## 2018-10-09 ENCOUNTER — Ambulatory Visit (INDEPENDENT_AMBULATORY_CARE_PROVIDER_SITE_OTHER): Payer: Commercial Managed Care - PPO | Admitting: Physician Assistant

## 2018-10-09 ENCOUNTER — Encounter: Payer: Self-pay | Admitting: Physician Assistant

## 2018-10-09 VITALS — Ht 74.0 in | Wt 335.0 lb

## 2018-10-09 DIAGNOSIS — F419 Anxiety disorder, unspecified: Secondary | ICD-10-CM

## 2018-10-09 MED ORDER — ESCITALOPRAM OXALATE 20 MG PO TABS: 20.0000 mg | ORAL_TABLET | Freq: Every day | ORAL | 1 refills | Status: DC

## 2018-10-09 MED ORDER — LORAZEPAM 0.5 MG PO TABS: 0.5000 mg | ORAL_TABLET | Freq: Two times a day (BID) | ORAL | 0 refills | Status: DC | PRN

## 2018-10-09 NOTE — Telephone Encounter (Signed)
Please call pt and schedule Webex with Samantha.

## 2018-10-09 NOTE — Progress Notes (Signed)
Virtual Visit via Video   I connected with [name] on 10/09/18 at  1:40 PM EDT by a video enabled telemedicine application and verified that I am speaking with the correct person using two identifiers. Location patient: Home Location provider: [facility], Office Persons participating in the virtual visit: [place], [place], [place], Inda Coke, [place]  I discussed the limitations of evaluation and management by telemedicine and the availability of in person appointments. The patient expressed understanding and agreed to proceed.  Subjective:  I acted as a Education administrator for [employer], [employer], LPN  HPI:  Anxiety Pt c/o increase in anxiety past few weeks due to Coronavirus. Pt would like to increase his Lexapro. Pt c/o feeling very anxious almost to the point of a panic attack but not full blown attacks. Pt is having some tachycardia pulse is 90-100. He is having insomnia, feels like he can't turn his mind off and is very restless. Pt denies chest pain or SOB, cough, fever or chills. Pt has not traveled or been exposed per pt.  History of taking ativan prn for anxiety/panic/insomnia. Has never been on any other medication for sleep.  GAD 7 : Generalized Anxiety Score 10/09/2018 08/05/2018 12/12/2016  Nervous, Anxious, on Edge 3 1 1   Control/stop worrying 2 1 1   Worry too much - different things 1 2 1   Trouble relaxing 3 1 1   Restless 3 2 1   Easily annoyed or irritable 0 1 0  Afraid - awful might happen 3 0 0  Total GAD 7 Score 15 8 5   Anxiety Difficulty Somewhat difficult Somewhat difficult Not difficult at all      ROS: See pertinent positives and negatives per HPI.  Patient Active Problem List   Diagnosis Date Noted  . Mixed hyperlipidemia 11/24/2017  . LFTs abnormal 11/24/2017  . Morbid obesity ([place]) 12/12/2016  . Ulcerative pancolitis without complication ([place])   . Gastrointestinal hemorrhage 05/22/2016  . HNP (herniated nucleus  pulposus), lumbar 02/14/2016  . Spinal stenosis of lumbar region 02/14/2016  . Benign essential hypertension 03/28/2015  . Panic disorder with agoraphobia and moderate panic attacks 03/28/2015  . Controlled substance agreement signed 08/09/2014    Social History   Tobacco Use  . Smoking status: Never Smoker  . Smokeless tobacco: Never Used  Substance Use Topics  . Alcohol use: Yes    Comment: occ    Current Outpatient Medications:  .  lisinopril (PRINIVIL,ZESTRIL) 20 MG tablet, Take 1 tablet (20 mg total) by mouth daily., Disp: 90 tablet, Rfl: 1 .  mesalamine (LIALDA) 1.2 g EC tablet, Take 4 tablets (4.8 g total) by mouth daily., Disp: 120 tablet, Rfl: 11 .  escitalopram (LEXAPRO) 20 MG tablet, Take 1 tablet (20 mg total) by mouth daily., Disp: 90 tablet, Rfl: 1 .  LORazepam (ATIVAN) 0.5 MG tablet, Take 1 tablet (0.5 mg total) by mouth 2 (two) times daily as needed for anxiety., Disp: 30 tablet, Rfl: 0  Allergies  Allergen Reactions  . Penicillins Hives    Has patient had a PCN reaction causing immediate rash, facial/tongue/throat swelling, SOB or lightheadedness with hypotension: no Has patient had a PCN reaction causing severe rash involving mucus membranes or skin necrosis: no Has patient had a PCN reaction that required hospitalization no Has patient had a PCN reaction occurring within the last 10 years: yes If all of the above answers are "NO", then may proceed with Cephalosporin use.  . Sulfa Antibiotics Other (See Comments)  unsure    Objective:   VITALS: Per patient if applicable, see vitals. GENERAL: Alert, appears well and in no acute distress. HEENT: Atraumatic, conjunctiva clear, no obvious abnormalities on inspection of external nose and ears. NECK: Normal movements of the head and neck. CARDIOPULMONARY: No increased WOB. Speaking in clear sentences. I:E ratio WNL.  MS: Moves all visible extremities without noticeable abnormality. PSYCH: Pleasant and  cooperative, well-groomed. Speech normal rate and rhythm. Affect is appropriate. Insight and judgement are appropriate. Attention is focused, linear, and appropriate.  NEURO: CN grossly intact. Oriented as arrived to appointment on time with no prompting. Moves both UE equally.  SKIN: No obvious lesions, wounds, erythema, or cyanosis noted on face or hands.  Assessment and Plan:   Marvion was seen today for anxiety.  Diagnoses and all orders for this visit:  Anxiety Uncontrolled. Situational. Increase Lexapro to 20 mg daily. Start ativan prn. Follow-up in 6 months, sooner if needed.  Other orders -     escitalopram (LEXAPRO) 20 MG tablet; Take 1 tablet (20 mg total) by mouth daily. -     LORazepam (ATIVAN) 0.5 MG tablet; Take 1 tablet (0.5 mg total) by mouth 2 (two) times daily as needed for anxiety.    . Reviewed expectations re: course of current medical issues. . Discussed self-management of symptoms. . Outlined signs and symptoms indicating need for more acute intervention. . Patient verbalized understanding and all questions were answered. Marland Kitchen Health Maintenance issues including appropriate healthy diet, exercise, and smoking avoidance were discussed with patient. . See orders for this visit as documented in the electronic medical record.   I discussed the assessment and treatment plan with the patient. The patient was provided an opportunity to ask questions and all were answered. The patient agreed with the plan and demonstrated an understanding of the instructions.   The patient was advised to call back or seek an in-person evaluation if the symptoms worsen or if the condition fails to improve as anticipated.  CMA or LPN served as scribe during this visit. History, Physical, and Plan performed by medical provider. The above documentation has been reviewed and is accurate and complete.     [place], [place] 10/09/2018

## 2018-11-17 ENCOUNTER — Encounter: Payer: Self-pay | Admitting: General Surgery

## 2018-11-18 ENCOUNTER — Encounter: Payer: Self-pay | Admitting: Gastroenterology

## 2018-11-18 ENCOUNTER — Other Ambulatory Visit (INDEPENDENT_AMBULATORY_CARE_PROVIDER_SITE_OTHER): Payer: Commercial Managed Care - PPO

## 2018-11-18 ENCOUNTER — Ambulatory Visit: Payer: Commercial Managed Care - PPO | Admitting: Gastroenterology

## 2018-11-18 ENCOUNTER — Ambulatory Visit (INDEPENDENT_AMBULATORY_CARE_PROVIDER_SITE_OTHER): Payer: Commercial Managed Care - PPO | Admitting: Gastroenterology

## 2018-11-18 ENCOUNTER — Other Ambulatory Visit: Payer: Self-pay

## 2018-11-18 VITALS — Ht 74.0 in | Wt 330.0 lb

## 2018-11-18 DIAGNOSIS — R197 Diarrhea, unspecified: Secondary | ICD-10-CM

## 2018-11-18 DIAGNOSIS — R945 Abnormal results of liver function studies: Secondary | ICD-10-CM

## 2018-11-18 DIAGNOSIS — K5100 Ulcerative (chronic) pancolitis without complications: Secondary | ICD-10-CM | POA: Diagnosis not present

## 2018-11-18 DIAGNOSIS — R7989 Other specified abnormal findings of blood chemistry: Secondary | ICD-10-CM

## 2018-11-18 LAB — COMPREHENSIVE METABOLIC PANEL
ALT: 59 U/L — ABNORMAL HIGH (ref 0–53)
AST: 35 U/L (ref 0–37)
Albumin: 4 g/dL (ref 3.5–5.2)
Alkaline Phosphatase: 71 U/L (ref 39–117)
BUN: 13 mg/dL (ref 6–23)
CO2: 27 mEq/L (ref 19–32)
Calcium: 8.9 mg/dL (ref 8.4–10.5)
Chloride: 103 mEq/L (ref 96–112)
Creatinine, Ser: 0.91 mg/dL (ref 0.40–1.50)
GFR: 95.48 mL/min (ref >60.00)
Glucose, Bld: 120 mg/dL — ABNORMAL HIGH (ref 70–99)
Potassium: 3.7 mEq/L (ref 3.5–5.1)
Sodium: 138 mEq/L (ref 135–145)
Total Bilirubin: 0.2 mg/dL (ref 0.2–1.2)
Total Protein: 6.9 g/dL (ref 6.0–8.3)

## 2018-11-18 LAB — HIGH SENSITIVITY CRP: CRP, High Sensitivity: 33.98 mg/L — ABNORMAL HIGH (ref 0.000–5.000)

## 2018-11-18 LAB — CBC WITH DIFFERENTIAL/PLATELET
Basophils Absolute: 0 10*3/uL (ref 0.0–0.1)
Basophils Relative: 0.4 % (ref 0.0–3.0)
Eosinophils Absolute: 0.3 10*3/uL (ref 0.0–0.7)
Eosinophils Relative: 2.7 % (ref 0.0–5.0)
HCT: 37.4 % — ABNORMAL LOW (ref 39.0–52.0)
Hemoglobin: 12.9 g/dL — ABNORMAL LOW (ref 13.0–17.0)
Lymphocytes Relative: 29.9 % (ref 12.0–46.0)
Lymphs Abs: 2.9 10*3/uL (ref 0.7–4.0)
MCHC: 34.4 g/dL (ref 30.0–36.0)
MCV: 86.2 fl (ref 78.0–100.0)
Monocytes Absolute: 0.7 10*3/uL (ref 0.1–1.0)
Monocytes Relative: 7 % (ref 3.0–12.0)
Neutro Abs: 5.8 10*3/uL (ref 1.4–7.7)
Neutrophils Relative %: 60 % (ref 43.0–77.0)
Platelets: 379 10*3/uL (ref 150.0–400.0)
RBC: 4.34 Mil/uL (ref 4.22–5.81)
RDW: 12.7 % (ref 11.5–15.5)
WBC: 9.7 10*3/uL (ref 4.0–10.5)

## 2018-11-18 LAB — SEDIMENTATION RATE: Sed Rate: 41 mm/hr — ABNORMAL HIGH (ref 0–15)

## 2018-11-18 NOTE — Progress Notes (Signed)
Review of pertinent gastrointestinal problems: 1. Extensive ulcerative colitis:terrible bloody diarrhea, abd pains, nausea/vomting.  diagnosed when admitted with GI bleeding 11/8/17through 05/24/16, colonoscopy showednormal ileum, severe inflammation from rectum to cecum Pathology revealed chronic active ulcerative colitis throughout the entire colon. Patient was started on Prednisone 20 mg twice a day and Lialda4 pills daily. He responded very well, tapered easily off the prednisone. 2.  Elevated liver tests likely from underlying fatty liver disease.  AST 40s, ALT 90s.  Blood work December 2019 shows normal iron studies, hepatitis B surface antigen was negative, hepatitis B surface antibody was negative hepatitis a total antibody was negative, hepatitis C antibody was negative, ANA, anti-smooth muscle antibody and alpha-1 antitrypsin were all negative, AMA was negative, ceruloplasmin celiac sprue testing was negative as well.  Abdominal ultrasound December 2019 showed likely fatty liver, no obvious cirrhosis, no bili obstruction.  This service was provided via virtual visit.  Both audio and visual were used. The patient was located at home.  I was located in my office.  The patient did consent to this virtual visit and is aware of possible charges through their insurance for this visit.  The patient is an established patient.  My certified medical assistant, Grace Bushy, contributed to this visit by contacting the patient by phone 1 or 2 business days prior to the appointment and also followed up on the recommendations I made after the visit.  Time spent on virtual visit: 22 min   HPI: This is a very pleasant 33 year old man who I met by telemedicine visit today  I last saw him about 6 months ago.  His extensive ulcerative colitis was under very good control on full strength oral mesalamine.  At that time I checked a CBC and complete metabolic profile.  His liver tests had been elevated for at  least a year prior to that.  AST was 45, ALT 94.  This led to a battery of other blood tests  He has noticed a change in his bowels over the past month or so.  His bowels have been problemic in past 3-4 weeks.  Diarrhea (3-7 times per day), non bloody.   Pretty sudden onset.  No antibiotics.  Mild cramping pains.  Very similar to his UC without the bleeding however  No sick contacts, no antibiotics recently.  He avoids NSAIDs.  No travel.  No real changes in his diet or alcohol, caffeine intake.  Overall his weight is been stable.  He feels like he is keeping up with his hydration very well.  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  Constitutional:  No unintentional weight loss   Past Medical History:  Diagnosis Date  . Hypertension   . Panic anxiety syndrome     Past Surgical History:  Procedure Laterality Date  . COLONOSCOPY N/A 05/24/2016   Procedure: COLONOSCOPY;  Surgeon: [name], MD;  Location: [facility];  Service: Endoscopy;  Laterality: N/A;  . ESOPHAGOGASTRODUODENOSCOPY N/A 05/24/2016   Procedure: ESOPHAGOGASTRODUODENOSCOPY (EGD);  Surgeon: [name], MD;  Location: [facility];  Service: Endoscopy;  Laterality: N/A;  . LUMBAR LAMINECTOMY/DECOMPRESSION MICRODISCECTOMY N/A 02/14/2016   Procedure: MICRO  LUMBER L4-5;  Surgeon: Susa Day, MD;  Location: [facility];  Service: Orthopedics;  Laterality: N/A;  . NO PAST SURGERIES      Current Outpatient Medications  Medication Sig  Dispense Refill  . escitalopram (LEXAPRO) 20 MG tablet Take 1 tablet (20 mg total) by mouth daily. 90 tablet 1  . lisinopril (PRINIVIL,ZESTRIL) 20 MG tablet Take 1 tablet (20 mg total) by mouth daily. 90 tablet 1  . LORazepam (ATIVAN) 0.5 MG tablet Take 1 tablet (0.5 mg total) by mouth 2 (two) times daily as needed for anxiety. 30 tablet 0  .  mesalamine (LIALDA) 1.2 g EC tablet Take 4 tablets (4.8 g total) by mouth daily. 120 tablet 11   No current facility-administered medications for this visit.     Allergies as of 11/18/2018 - Review Complete 11/17/2018  Allergen Reaction Noted  . Penicillins Hives 02/06/2016  . Sulfa antibiotics Other (See Comments) 02/06/2016    Family History  Problem Relation Age of Onset  . Hypertension Mother   . Diabetes Mother   . Hyperlipidemia Mother   . Hypertension Father   . Diabetes Father   . Inflammatory bowel disease Other   . Diabetes Other   . High Cholesterol Maternal Grandmother   . Hypertension Maternal Grandmother   . Heart disease Maternal Grandfather   . High Cholesterol Maternal Grandfather   . Hypertension Maternal Grandfather   . Stroke Maternal Grandfather   . Cancer Paternal Grandmother   . Diabetes Paternal Grandmother   . Heart disease Paternal Grandmother   . High Cholesterol Paternal Grandmother   . Hypertension Paternal Grandmother   . Heart disease Paternal Grandfather   . High Cholesterol Paternal Grandfather   . Hypertension Paternal Grandfather   . Stroke Paternal Grandfather     Social History   Socioeconomic History  . Marital status: Married    Spouse name: Not on file  . Number of children: 0  . Years of education: Not on file  . Highest education level: Not on file  Occupational History  . Not on file  Social Needs  . Financial resource strain: Not on file  . Food insecurity:    Worry: Not on file    Inability: Not on file  . Transportation needs:    Medical: Not on file    Non-medical: Not on file  Tobacco Use  . Smoking status: Never Smoker  . Smokeless tobacco: Never Used  Substance and Sexual Activity  . Alcohol use: Yes    Comment: occ  . Drug use: No  . Sexual activity: Yes    Partners: Female  Lifestyle  . Physical activity:    Days per week: Not on file    Minutes per session: Not on file  . Stress: Not on file   Relationships  . Social connections:    Talks on phone: Not on file    Gets together: Not on file    Attends religious service: Not on file    Active member of club or organization: Not on file    Attends meetings of clubs or organizations: Not on file    Relationship status: Not on file  . Intimate partner violence:    Fear of current or ex partner: Not on file    Emotionally abused: Not on file    Physically abused: Not on file    Forced sexual activity: Not on file  Other Topics Concern  . 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 male with extensive ulcerative colitis, new acute nonbloody diarrhea, elevated liver tests from likely fatty liver disease  He may be experiencing a flare of his ulcerative colitis, it is a bit unusual that he is having 0 bleeding this time however.  He will come in today for blood work and stool testing including a GI pathogen panel, CBC, complete metabolic profile, sedimentation rate and CRP.  If no obvious infection then I will likely start him on a brief prednisone course.  I will also be interested to see his LFTs, he has likely underlying fatty liver.  Please see the "Patient Instructions" section for addition details about the plan.  [name], MD [facility] 11/18/2018, 3:32 PM

## 2018-11-18 NOTE — Patient Instructions (Addendum)
He will come by later today for blood work and stool tests: GI pathogen panel, CBC, complete metabolic profile, sedimentation rate, CRP.  Your provider has requested that you go to the basement level for lab work  today. Press "B" on the elevator. The lab is located at the first door on the left as you exit the elevator.

## 2018-11-19 ENCOUNTER — Other Ambulatory Visit: Payer: Commercial Managed Care - PPO

## 2018-11-19 DIAGNOSIS — R945 Abnormal results of liver function studies: Secondary | ICD-10-CM

## 2018-11-19 DIAGNOSIS — K5100 Ulcerative (chronic) pancolitis without complications: Secondary | ICD-10-CM | POA: Diagnosis not present

## 2018-11-19 DIAGNOSIS — R7989 Other specified abnormal findings of blood chemistry: Secondary | ICD-10-CM

## 2018-11-19 DIAGNOSIS — R197 Diarrhea, unspecified: Secondary | ICD-10-CM | POA: Diagnosis not present

## 2018-11-24 LAB — GASTROINTESTINAL PATHOGEN PANEL PCR
C. difficile Tox A/B, PCR: NOT DETECTED
Campylobacter, PCR: NOT DETECTED
Cryptosporidium, PCR: NOT DETECTED
E coli (ETEC) LT/ST PCR: NOT DETECTED
E coli (STEC) stx1/stx2, PCR: NOT DETECTED
E coli 0157, PCR: NOT DETECTED
Giardia lamblia, PCR: NOT DETECTED
Norovirus, PCR: NOT DETECTED
Rotavirus A, PCR: NOT DETECTED
Salmonella, PCR: NOT DETECTED
Shigella, PCR: NOT DETECTED

## 2018-11-25 ENCOUNTER — Other Ambulatory Visit: Payer: Self-pay | Admitting: Gastroenterology

## 2018-11-25 MED ORDER — PREDNISONE 10 MG PO TABS: ORAL_TABLET | ORAL | 1 refills | Status: DC

## 2018-12-08 ENCOUNTER — Encounter: Payer: Self-pay | Admitting: General Surgery

## 2018-12-09 ENCOUNTER — Encounter: Payer: Self-pay | Admitting: Gastroenterology

## 2018-12-09 ENCOUNTER — Ambulatory Visit (INDEPENDENT_AMBULATORY_CARE_PROVIDER_SITE_OTHER): Payer: Commercial Managed Care - PPO | Admitting: Gastroenterology

## 2018-12-09 ENCOUNTER — Other Ambulatory Visit: Payer: Self-pay

## 2018-12-09 VITALS — Ht 74.0 in | Wt 330.0 lb

## 2018-12-09 DIAGNOSIS — K5100 Ulcerative (chronic) pancolitis without complications: Secondary | ICD-10-CM | POA: Diagnosis not present

## 2018-12-09 NOTE — Progress Notes (Signed)
Review of pertinent gastrointestinal problems: 1. Extensive ulcerative colitis:terrible bloody diarrhea, abd pains, nausea/vomting.diagnosed when admitted with GI bleeding 11/8/17through 05/24/16, colonoscopy showednormal ileum, severe inflammation from rectum to cecum Pathology revealed chronic active ulcerative colitis throughout the entire colon. Patient was started on Prednisone 20 mg twice a day and Lialda4 pills daily. He responded very well, tapered easily off the prednisone.  Flare 10/2018: Non-bloody diarrhea, urgency.  GI path panel negative, sed rate 41, CRP 34.  Started prednisone 67m daily. 2.  Elevated liver tests likely from underlying fatty liver disease.  AST 40s, ALT 90s.  Blood work December 2019 shows normal iron studies, hepatitis B surface antigen was negative, hepatitis B surface antibody was negative hepatitis a total antibody was negative, hepatitis C antibody was negative, ANA, anti-smooth muscle antibody and alpha-1 antitrypsin were all negative, AMA was negative, ceruloplasmin celiac sprue testing was negative as well.  Abdominal ultrasound December 2019 showed likely fatty liver, no obvious cirrhosis, no bili obstruction.   This service was provided via virtual visit.  Both audio and visual were used. The patient was located at home.  I was located in my office.  The patient did consent to this virtual visit and is aware of possible charges through their insurance for this visit.  The patient is an established patient.  My certified medical assistant, KGrace Bushy contributed to this visit by contacting the patient by phone 1 or 2 business days prior to the appointment and also followed up on the recommendations I made after the visit.  Time spent on virtual visit: 18 minutes   HPI: This is a very pleasant 33year old man whom I last visited with about 3 weeks ago by telemedicine.  I concluded that he was having a mild flare of his ulcerative colitis and started him  on prednisone 20 mg once daily.  Since starting the prednisone his bowels have completely normalized.  The urgency is gone, he has no abdominal pains.  He is having solid stools at about once or twice per day.   Chief complaint is ulcerative colitis  ROS: complete GI ROS as described in HPI, all other review negative.  Constitutional:  No unintentional weight loss   Past Medical History:  Diagnosis Date  . Hypertension   . Panic anxiety syndrome     Past Surgical History:  Procedure Laterality Date  . COLONOSCOPY N/A 05/24/2016   Procedure: COLONOSCOPY;  Surgeon: DMilus Banister MD;  Location: [facility];  Service: Endoscopy;  Laterality: N/A;  . ESOPHAGOGASTRODUODENOSCOPY N/A 05/24/2016   Procedure: ESOPHAGOGASTRODUODENOSCOPY (EGD);  Surgeon: DMilus Banister MD;  [facility];  Service: Endoscopy;  Laterality: N/A;  . LUMBAR LAMINECTOMY/DECOMPRESSION MICRODISCECTOMY N/A 02/14/2016   Procedure: MICRO  LUMBER L4-5;  Surgeon: JSusa Day MD;  Location: [facility];  Service: Orthopedics;  Laterality: N/A;  . NO PAST SURGERIES      Current Outpatient Medications  Medication Sig Dispense Refill  . escitalopram (LEXAPRO) 20 MG tablet Take 1 tablet (20 mg total) by mouth daily. 90 tablet 1  . lisinopril (PRINIVIL,ZESTRIL) 20 MG tablet Take 1 tablet (20 mg total) by mouth daily. 90 tablet 1  . LORazepam (ATIVAN) 0.5 MG tablet Take 1 tablet (0.5 mg total) by mouth 2 (two) times daily as needed for anxiety. 30 tablet 0  . mesalamine (LIALDA) 1.2 g EC tablet Take 4 tablets (4.8 g total) by mouth daily. 120 tablet 11  . predniSONE (DELTASONE) 10 MG tablet Take 2 tabs daily 60 tablet 1  No current facility-administered medications for this visit.     Allergies as of 12/09/2018 - Review Complete 12/09/2018  Allergen Reaction Noted  . Penicillins Hives 02/06/2016  . Sulfa antibiotics Other (See Comments) 02/06/2016    Family History  Problem Relation Age of Onset  .  Hypertension Mother   . Diabetes Mother   . Hyperlipidemia Mother   . Hypertension Father   . Diabetes Father   . Inflammatory bowel disease Other   . Diabetes Other   . High Cholesterol Maternal Grandmother   . Hypertension Maternal Grandmother   . Heart disease Maternal Grandfather   . High Cholesterol Maternal Grandfather   . Hypertension Maternal Grandfather   . Stroke Maternal Grandfather   . Cancer Paternal Grandmother   . Diabetes Paternal Grandmother   . Heart disease Paternal Grandmother   . High Cholesterol Paternal Grandmother   . Hypertension Paternal Grandmother   . Heart disease Paternal Grandfather   . High Cholesterol Paternal Grandfather   . Hypertension Paternal Grandfather   . Stroke Paternal Grandfather     Social History   Socioeconomic History  . Marital status: Married    Spouse name: Not on file  . Number of children: 0  . Years of education: Not on file  . Highest education level: Not on file  Occupational History  . Not on file  Social Needs  . Financial resource strain: Not on file  . Food insecurity:    Worry: Not on file    Inability: Not on file  . Transportation needs:    Medical: Not on file    Non-medical: Not on file  Tobacco Use  . Smoking status: Never Smoker  . Smokeless tobacco: Never Used  Substance and Sexual Activity  . Alcohol use: Yes    Comment: occ  . Drug use: No  . Sexual activity: Yes    Partners: Female  Lifestyle  . Physical activity:    Days per week: Not on file    Minutes per session: Not on file  . Stress: Not on file  Relationships  . Social connections:    Talks on phone: Not on file    Gets together: Not on file    Attends religious service: Not on file    Active member of club or organization: Not on file    Attends meetings of clubs or organizations: Not on file    Relationship status: Not on file  . Intimate partner violence:    Fear of current or ex partner: Not on file    Emotionally  abused: Not on file    Physically abused: Not on file    Forced sexual activity: Not on file  Other Topics Concern  . 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 male with ulcerative colitis  Mild flare seems to have remitted with 20 mg once daily prednisone.  I instructed him to begin a slow 5 mg/week taper until he is completely off of the prednisone.  He will continue his oral mesalamine indefinitely.  Hopefully this will capture him and he will stay in remitted status for at least many months or few years.  If he recurs during the taper or soon thereafter then we might need to discuss advancing  therapy either to Biologics or i immunomodulators.  He will return to see me in 5 or 6 weeks, sooner if any troubles.  Hopefully that appointment will be in person  Please see the "Patient Instructions" section for addition details about the plan.  [name], MD [facility] 12/09/2018, 8:05 AM

## 2018-12-09 NOTE — Patient Instructions (Addendum)
He knows to begin tapering his prednisone by 5 mg/week.  He should be off of prednisone completely in about 4 weeks.  He will continue Lialda 4 pills once daily.  He knows to call if he has any issues while tapering   Return office visit with Dr. Ardis Hughs in 5 or 6 weeks, hopefully in person.  Thank you for entrusting me with your care and choosing [facility].  Dr Ardis Hughs

## 2019-01-12 ENCOUNTER — Telehealth: Payer: Self-pay

## 2019-01-12 NOTE — Telephone Encounter (Signed)
Covid-19 screening questions   Do you now or have you had a fever in the last 14 days? no  Do you have any respiratory symptoms of shortness of breath or cough now or in the last 14 days? no  Do you have any family members or close contacts with diagnosed or suspected Covid-19 in the past 14 days? no  Have you been tested for Covid-19 and found to be positive? no

## 2019-01-13 ENCOUNTER — Ambulatory Visit (INDEPENDENT_AMBULATORY_CARE_PROVIDER_SITE_OTHER): Payer: Commercial Managed Care - PPO | Admitting: Gastroenterology

## 2019-01-13 ENCOUNTER — Encounter: Payer: Self-pay | Admitting: Gastroenterology

## 2019-01-13 VITALS — BP 132/94 | HR 91 | Temp 99.2°F | Ht 75.0 in | Wt 348.0 lb

## 2019-01-13 DIAGNOSIS — K5100 Ulcerative (chronic) pancolitis without complications: Secondary | ICD-10-CM | POA: Diagnosis not present

## 2019-01-13 NOTE — Progress Notes (Signed)
Review of pertinent gastrointestinal problems: 1. Extensive ulcerative colitis:terrible bloody diarrhea, abd pains, nausea/vomting.diagnosed when admitted with GI bleeding 11/8/17through 05/24/16, colonoscopy showednormal ileum, severe inflammation from rectum to cecum Pathology revealed chronic active ulcerative colitis throughout the entire colon. Patient was started on Prednisone 20 mg twice a day and Lialda4 pills daily. He responded very well, tapered easily off the prednisone.  Flare 10/2018: Non-bloody diarrhea, urgency.  GI path panel negative, sed rate 41, CRP 34.  Started prednisone 69m daily.  Improved quickly on steroids.   2.Elevated liver tests likely from underlying fatty liver disease.AST 40s, ALT 90s. Blood work December 2019 shows normal iron studies, hepatitis B surface antigen was negative, hepatitis B surface antibody was negative hepatitis a total antibody was negative,hepatitis C antibody was negative,ANA, anti-smooth muscle antibody and alpha-1 antitrypsin were all negative, AMA was negative,ceruloplasmin celiac sprue testing was negative as well. Abdominal ultrasound December 2019 showed likely fatty liver, no obvious cirrhosis, no bili obstruction.   HPI: This is a very pleasant 33year old man whom I last saw about a month ago via telemedicine visit.  Since then he started tapering his prednisone and he has been completely off of prednisone for 10 days to 2 weeks now.  His bowels are absolutely back to normal.  He has 1-2 soft formed bowel movements daily.  No diarrhea.  No bleeding.  No abdominal pains.  He continues to take Lialda 4 pills once daily.  Chief complaint is ulcerative colitis  ROS: complete GI ROS as described in HPI, all other review negative.  Constitutional:  No unintentional weight loss   Past Medical History:  Diagnosis Date  . Hypertension   . Panic anxiety syndrome     Past Surgical History:  Procedure Laterality Date  .  COLONOSCOPY N/A 05/24/2016   Procedure: COLONOSCOPY;  Surgeon: DMilus Banister MD;  [facility];  Service: Endoscopy;  Laterality: N/A;  . ESOPHAGOGASTRODUODENOSCOPY N/A 05/24/2016   Procedure: ESOPHAGOGASTRODUODENOSCOPY (EGD);  Surgeon: DMilus Banister MD;  [facility];  Service: Endoscopy;  Laterality: N/A;  . LUMBAR LAMINECTOMY/DECOMPRESSION MICRODISCECTOMY N/A 02/14/2016   Procedure: MICRO  LUMBER L4-5;  Surgeon: JSusa Day MD;  Location: [facility];  Service: Orthopedics;  Laterality: N/A;  . NO PAST SURGERIES      Current Outpatient Medications  Medication Sig Dispense Refill  . escitalopram (LEXAPRO) 20 MG tablet Take 1 tablet (20 mg total) by mouth daily. 90 tablet 1  . lisinopril (PRINIVIL,ZESTRIL) 20 MG tablet Take 1 tablet (20 mg total) by mouth daily. 90 tablet 1  . LORazepam (ATIVAN) 0.5 MG tablet Take 1 tablet (0.5 mg total) by mouth 2 (two) times daily as needed for anxiety. 30 tablet 0  . mesalamine (LIALDA) 1.2 g EC tablet Take 4 tablets (4.8 g total) by mouth daily. 120 tablet 11   No current facility-administered medications for this visit.     Allergies as of 01/13/2019 - Review Complete 12/09/2018  Allergen Reaction Noted  . Penicillins Hives 02/06/2016  . Sulfa antibiotics Other (See Comments) 02/06/2016    Family History  Problem Relation Age of Onset  . Hypertension Mother   . Diabetes Mother   . Hyperlipidemia Mother   . Hypertension Father   . Diabetes Father   . Inflammatory bowel disease Other   . Diabetes Other   . High Cholesterol Maternal Grandmother   . Hypertension Maternal Grandmother   . Heart disease Maternal Grandfather   . High Cholesterol Maternal Grandfather   . Hypertension  Maternal Grandfather   . Stroke Maternal Grandfather   . Cancer Paternal Grandmother   . Diabetes Paternal Grandmother   . Heart disease Paternal Grandmother   . High Cholesterol Paternal Grandmother   . Hypertension Paternal Grandmother    . Heart disease Paternal Grandfather   . High Cholesterol Paternal Grandfather   . Hypertension Paternal Grandfather   . Stroke Paternal Grandfather     Social History   Socioeconomic History  . Marital status: Married    Spouse name: Not on file  . Number of children: 0  . Years of education: Not on file  . Highest education level: Not on file  Occupational History  . Not on file  Social Needs  . Financial resource strain: Not on file  . Food insecurity    Worry: Not on file    Inability: Not on file  . Transportation needs    Medical: Not on file    Non-medical: Not on file  Tobacco Use  . Smoking status: Never Smoker  . Smokeless tobacco: Never Used  Substance and Sexual Activity  . Alcohol use: Yes    Comment: occ  . Drug use: No  . Sexual activity: Yes    Partners: Female  Lifestyle  . Physical activity    Days per week: Not on file    Minutes per session: Not on file  . Stress: Not on file  Relationships  . Social Herbalist on phone: Not on file    Gets together: Not on file    Attends religious service: Not on file    Active member of club or organization: Not on file    Attends meetings of clubs or organizations: Not on file    Relationship status: Not on file  . Intimate partner violence    Fear of current or ex partner: Not on file    Emotionally abused: Not on file    Physically abused: Not on file    Forced sexual activity: Not on file  Other Topics Concern  . 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, no children   Fun: golf     Physical Exam: BP (!) 132/94 (BP Location: Left Arm, Patient Position: Sitting, Cuff Size: Large)   Pulse 91   Temp 99.2 F (37.3 C) (Temporal)   Ht 6' 3"  (1.905 m)   Wt (!) 348 lb (157.9 kg)   BMI 43.50 kg/m  Constitutional: generally well-appearing Psychiatric: alert and oriented x3 Abdomen: soft, nontender, nondistended, no obvious ascites, no  peritoneal signs, normal bowel sounds No peripheral edema noted in lower extremities  Assessment and plan: 33 y.o. male with extensive ulcerative colitis  He was briefly on prednisone at the time of diagnosis 3 years ago.  This is the first time he has had a flare and need for prednisone.  I was able to capture him back into clinical remission with 20 mg maximum prednisone dose for about a month course.  He understands that if he proves to intermittently require steroids for his inflammatory bowel disease then we should begin to discuss higher strength therapy such as Biologics, I mentioned Entyvio to him this afternoon.  For now he will continue Lialda 4 pills once daily.  He will return to see me in 12 months and sooner if needed.  He did mention an interesting history point, at the time of diagnosis he had just stopped Lexapro.  Shortly  before this recent flare he increased his Lexapro dosing.  I explained to him that I do not know of any relation between ulcerative colitis and Lexapro but admitted that certainly it is interesting that it  happened to him.  Please see the "Patient Instructions" section for addition details about the plan.  [name], MD [facility] 01/13/2019, 3:44 PM

## 2019-01-13 NOTE — Patient Instructions (Signed)
Return to office in 1 year,sooner if needed  Thank you for entrusting me with your care and choosing [facility].  Dr Ardis Hughs

## 2019-01-30 ENCOUNTER — Other Ambulatory Visit: Payer: Self-pay | Admitting: Physician Assistant

## 2019-01-30 DIAGNOSIS — I10 Essential (primary) hypertension: Secondary | ICD-10-CM

## 2019-03-03 ENCOUNTER — Other Ambulatory Visit: Payer: Self-pay | Admitting: Physician Assistant

## 2019-03-03 DIAGNOSIS — I10 Essential (primary) hypertension: Secondary | ICD-10-CM

## 2019-04-02 ENCOUNTER — Telehealth: Payer: Self-pay | Admitting: Physician Assistant

## 2019-04-02 DIAGNOSIS — I10 Essential (primary) hypertension: Secondary | ICD-10-CM

## 2019-04-02 NOTE — Telephone Encounter (Signed)
See note

## 2019-04-02 NOTE — Telephone Encounter (Signed)
Left detailed message on voicemail need to schedule follow up appt. Please call office on Monday to schedule. Rx's sent to pharmacy 30 day supply only.

## 2019-04-02 NOTE — Telephone Encounter (Signed)
Patient returning [name] call, please leave a message if patient is need of a follow up appointment

## 2019-04-05 NOTE — Telephone Encounter (Signed)
Pt has scheduled virtual appt for Wed, 9/23. Pt states he does have enough med to get him through until his appt, so no need to send a 30 day to pharmacy

## 2019-04-07 ENCOUNTER — Encounter: Payer: Self-pay | Admitting: Physician Assistant

## 2019-04-07 ENCOUNTER — Ambulatory Visit (INDEPENDENT_AMBULATORY_CARE_PROVIDER_SITE_OTHER): Payer: Commercial Managed Care - PPO | Admitting: Physician Assistant

## 2019-04-07 VITALS — BP 121/79 | HR 82 | Ht 75.0 in | Wt 330.0 lb

## 2019-04-07 DIAGNOSIS — I10 Essential (primary) hypertension: Secondary | ICD-10-CM | POA: Diagnosis not present

## 2019-04-07 NOTE — Progress Notes (Signed)
Virtual Visit via Video   I connected with [name] on 04/07/19 at  7:40 AM EDT by a video enabled telemedicine application and verified that I am speaking with the correct person using two identifiers. Location patient: Home Location provider: [facility], Office Persons participating in the virtual visit: [place], [place] PA-C, Anselmo Pickler, LPN   I discussed the limitations of evaluation and management by telemedicine and the availability of in person appointments. The patient expressed understanding and agreed to proceed.  I acted as a Education administrator for [employer], [employer], LPN  Subjective:   HPI:   Hypertension Pt following up today on blood pressure. Currently he is taking Lisinopril 20 mg daily. Pt is checking blood daily, averaging systolic 852'D, diastolic 78-24. Pt denies headaches, dizziness, blurred vision, chest pain, SOB or lower leg edema. Denies excessive caffeine intake, stimulant usage, excessive alcohol intake or increase in salt consumption.  ROS: See pertinent positives and negatives per HPI.  Patient Active Problem List   Diagnosis Date Noted  . Mixed hyperlipidemia 11/24/2017  . LFTs abnormal 11/24/2017  . Morbid obesity ([place]) 12/12/2016  . Ulcerative pancolitis without complication ([place])   . Gastrointestinal hemorrhage 05/22/2016  . HNP (herniated nucleus pulposus), lumbar 02/14/2016  . Spinal stenosis of lumbar region 02/14/2016  . Benign essential hypertension 03/28/2015  . Panic disorder with agoraphobia and moderate panic attacks 03/28/2015  . Controlled substance agreement signed 08/09/2014    Social History   Tobacco Use  . Smoking status: Never Smoker  . Smokeless tobacco: Never Used  Substance Use Topics  . Alcohol use: Yes    Comment: occ    Current Outpatient Medications:  .  escitalopram (LEXAPRO) 20 MG tablet, TAKE 1 TABLET BY MOUTH EVERY DAY, Disp: 30 tablet, Rfl: 0 .  lisinopril  (ZESTRIL) 20 MG tablet, TAKE 1 TABLET BY MOUTH EVERY DAY, Disp: 30 tablet, Rfl: 0 .  LORazepam (ATIVAN) 0.5 MG tablet, Take 1 tablet (0.5 mg total) by mouth 2 (two) times daily as needed for anxiety., Disp: 30 tablet, Rfl: 0 .  mesalamine (LIALDA) 1.2 g EC tablet, Take 4 tablets (4.8 g total) by mouth daily., Disp: 120 tablet, Rfl: 11  Allergies  Allergen Reactions  . Penicillins Hives    Has patient had a PCN reaction causing immediate rash, facial/tongue/throat swelling, SOB or lightheadedness with hypotension: no Has patient had a PCN reaction causing severe rash involving mucus membranes or skin necrosis: no Has patient had a PCN reaction that required hospitalization no Has patient had a PCN reaction occurring within the last 10 years: yes If all of the above answers are "NO", then may proceed with Cephalosporin use.  . Sulfa Antibiotics Other (See Comments)    unsure    Objective:   VITALS: Per patient if applicable, see vitals. GENERAL: Alert, appears well and in no acute distress. HEENT: Atraumatic, conjunctiva clear, no obvious abnormalities on inspection of external nose and ears. NECK: Normal movements of the head and neck. CARDIOPULMONARY: No increased WOB. Speaking in clear sentences. I:E ratio WNL.  MS: Moves all visible extremities without noticeable abnormality. PSYCH: Pleasant and cooperative, well-groomed. Speech normal rate and rhythm. Affect is appropriate. Insight and judgement are appropriate. Attention is focused, linear, and appropriate.  NEURO: CN grossly intact. Oriented as arrived to appointment on time with no prompting. Moves both UE equally.  SKIN: No obvious lesions, wounds, erythema, or cyanosis noted on face or hands.  Assessment and Plan:  Mister was seen today for hypertension.  Diagnoses and all orders for this visit:  Hypertension, unspecified type  Well controlled. Continue Lisinopril 20 mg. Follow-up in 6 months, sooner if concerns.  .  Reviewed expectations re: course of current medical issues. . Discussed self-management of symptoms. . Outlined signs and symptoms indicating need for more acute intervention. . Patient verbalized understanding and all questions were answered. Marland Kitchen Health Maintenance issues including appropriate healthy diet, exercise, and smoking avoidance were discussed with patient. . See orders for this visit as documented in the electronic medical record.  I discussed the assessment and treatment plan with the patient. The patient was provided an opportunity to ask questions and all were answered. The patient agreed with the plan and demonstrated an understanding of the instructions.   The patient was advised to call back or seek an in-person evaluation if the symptoms worsen or if the condition fails to improve as anticipated.   CMA or LPN served as scribe during this visit. History, Physical, and Plan performed by medical provider. The above documentation has been reviewed and is accurate and complete.   [place], [place] 04/07/2019

## 2019-05-03 ENCOUNTER — Other Ambulatory Visit: Payer: Self-pay | Admitting: Physician Assistant

## 2019-05-03 DIAGNOSIS — I10 Essential (primary) hypertension: Secondary | ICD-10-CM

## 2019-06-25 ENCOUNTER — Telehealth: Payer: Self-pay | Admitting: Gastroenterology

## 2019-06-25 MED ORDER — MESALAMINE 1.2 G PO TBEC: 4.8000 g | DELAYED_RELEASE_TABLET | Freq: Every day | ORAL | 11 refills | Status: DC

## 2019-06-25 NOTE — Telephone Encounter (Signed)
FYI:  This should go to the CMA Dr Ardis Hughs CMA is Nira Conn.  I have refilled.

## 2019-07-05 ENCOUNTER — Other Ambulatory Visit: Payer: Self-pay | Admitting: Physician Assistant

## 2019-07-05 DIAGNOSIS — I10 Essential (primary) hypertension: Secondary | ICD-10-CM

## 2019-07-05 MED ORDER — LISINOPRIL 20 MG PO TABS: 20.0000 mg | ORAL_TABLET | Freq: Every day | ORAL | 2 refills | Status: DC

## 2019-08-04 ENCOUNTER — Ambulatory Visit: Payer: Commercial Managed Care - PPO | Admitting: Gastroenterology

## 2019-09-18 ENCOUNTER — Ambulatory Visit: Payer: Commercial Managed Care - PPO | Attending: Internal Medicine

## 2019-11-01 ENCOUNTER — Other Ambulatory Visit: Payer: Self-pay | Admitting: Physician Assistant

## 2019-11-26 ENCOUNTER — Other Ambulatory Visit: Payer: Self-pay | Admitting: Family Medicine

## 2019-11-26 NOTE — Telephone Encounter (Signed)
LAST APPOINTMENT DATE:04/07/2019   NEXT APPOINTMENT DATE: Visit date not found    LAST REFILL:11/01/2019  QTY:30  0RF

## 2020-03-04 IMAGING — US US ABDOMEN COMPLETE
1 series · 14 of 25 positions shown · non-contrast
Comparison: None.

CLINICAL DATA: Elevated liver function tests.

EXAM:
ABDOMEN ULTRASOUND COMPLETE

[Series 1: us abdomen complete · 14 of 111 slices shown]
[im 1/111]
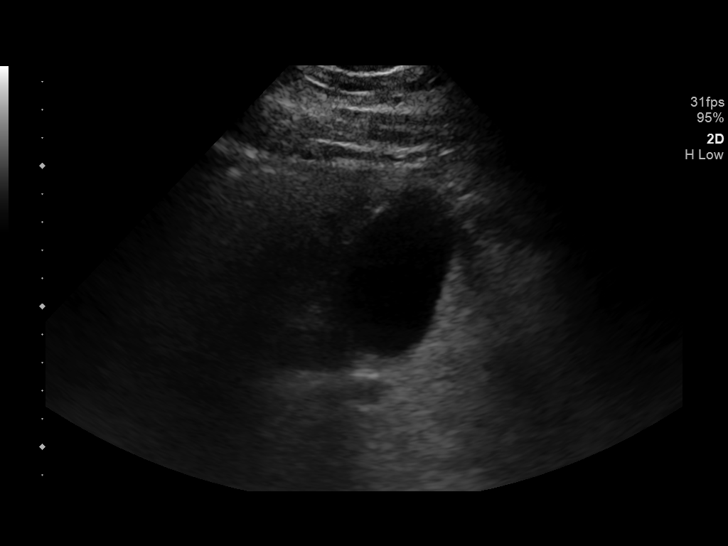
[im 10/111]
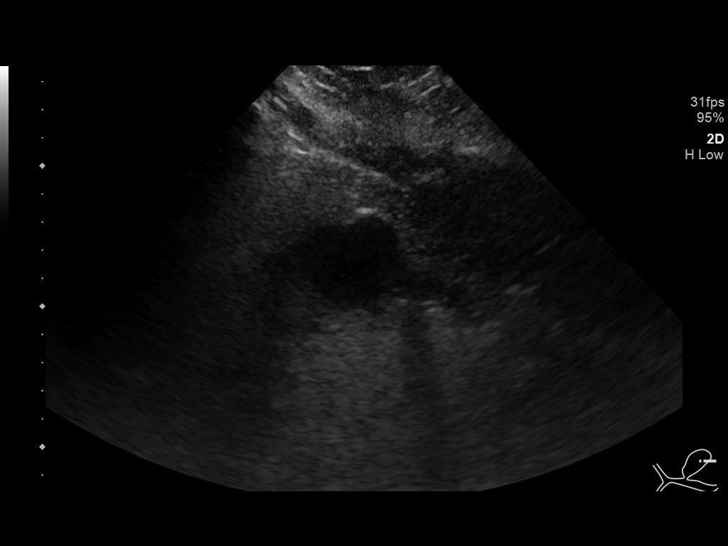
[im 19/111]
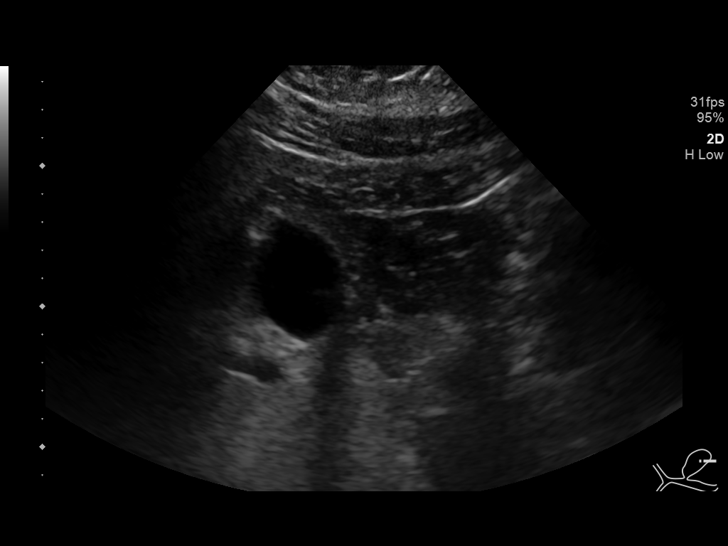
[im 28/111]
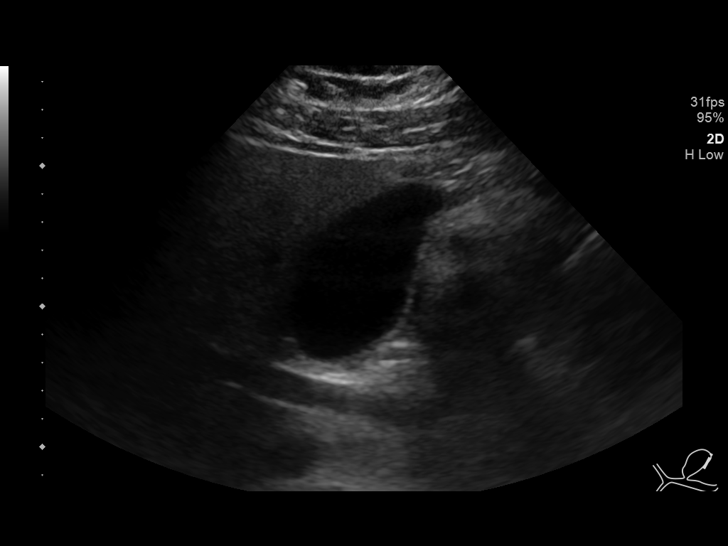
[im 37/111]
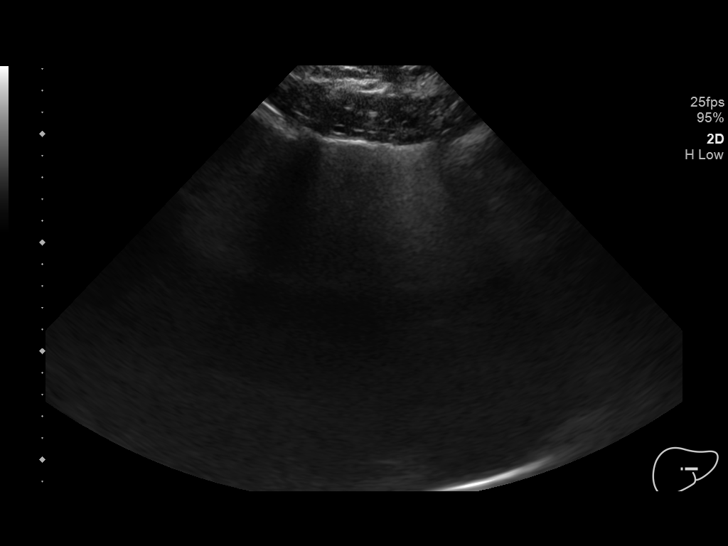
[im 42/111]
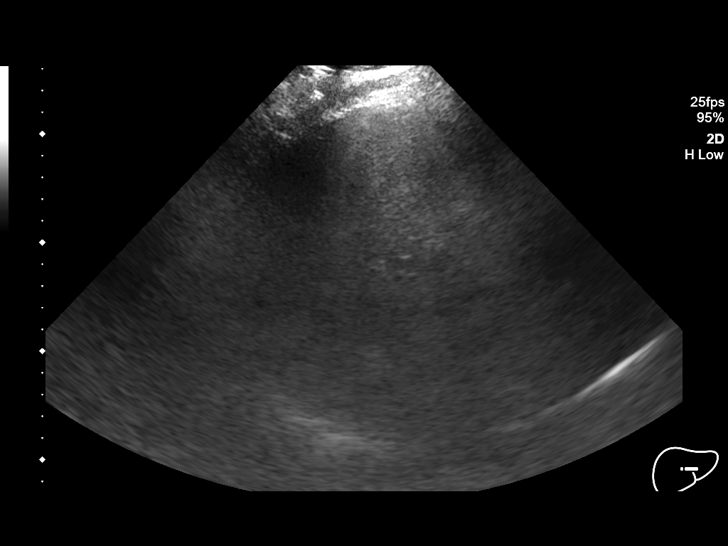
[im 51/111]
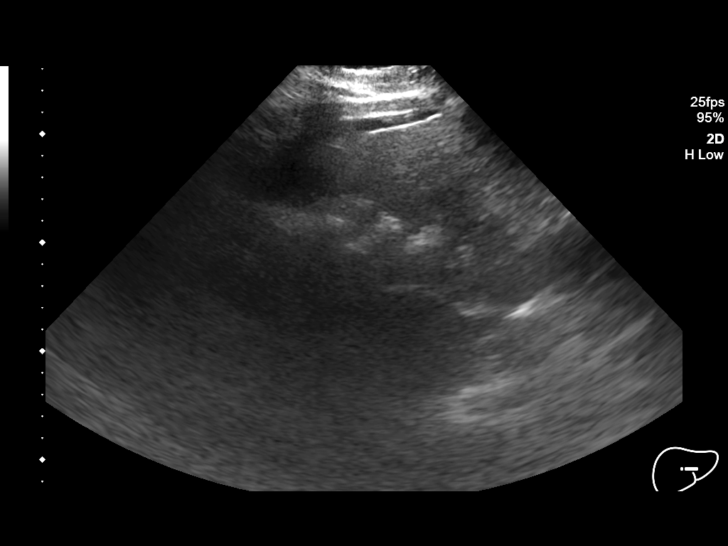
[im 60/111]
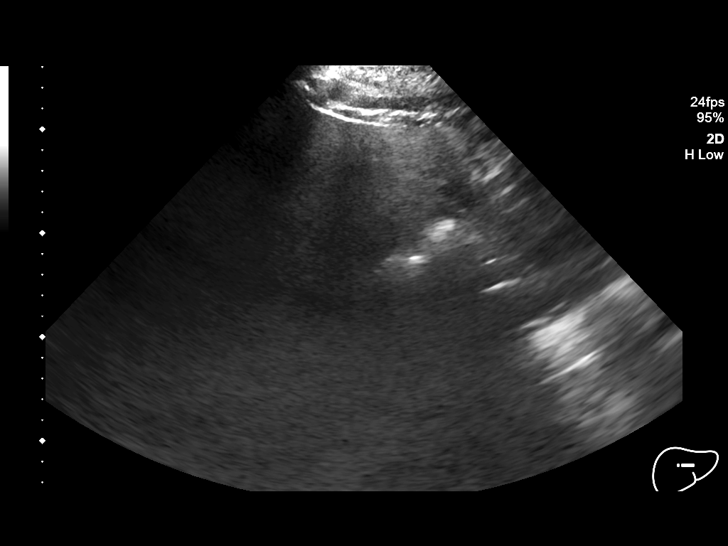
[im 69/111]
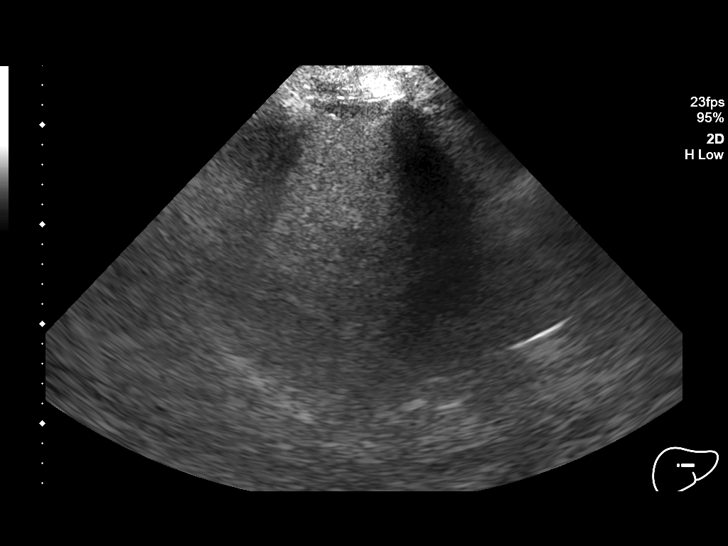
[im 74/111]
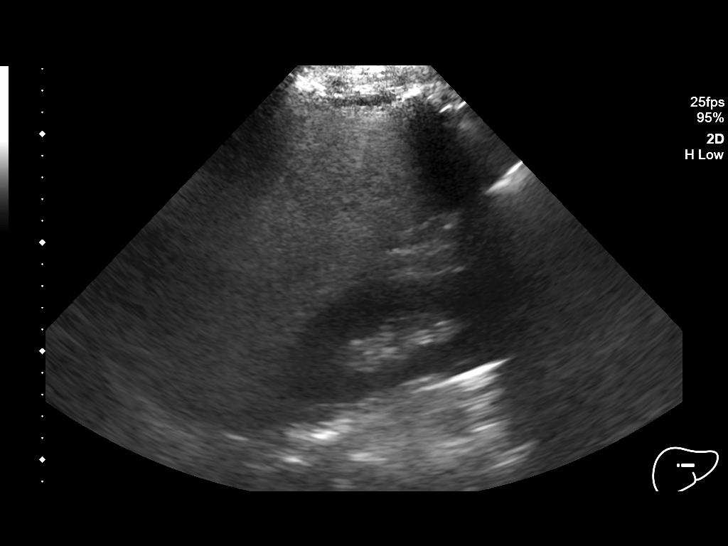
[im 83/111]
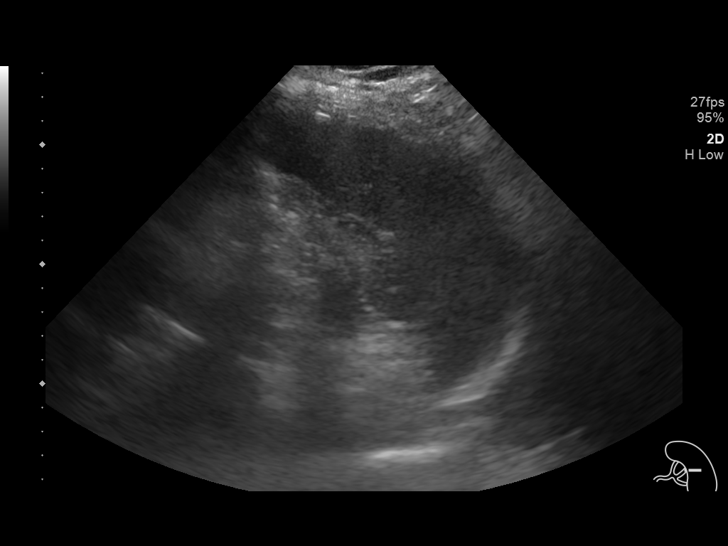
[im 92/111]
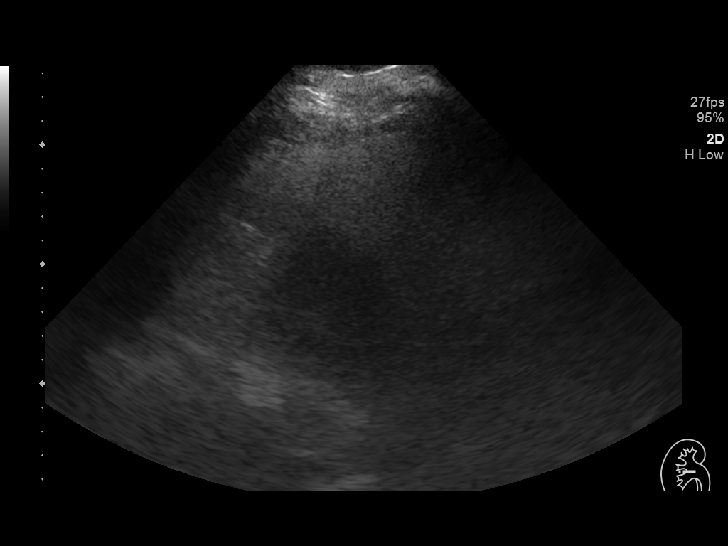
[im 101/111]
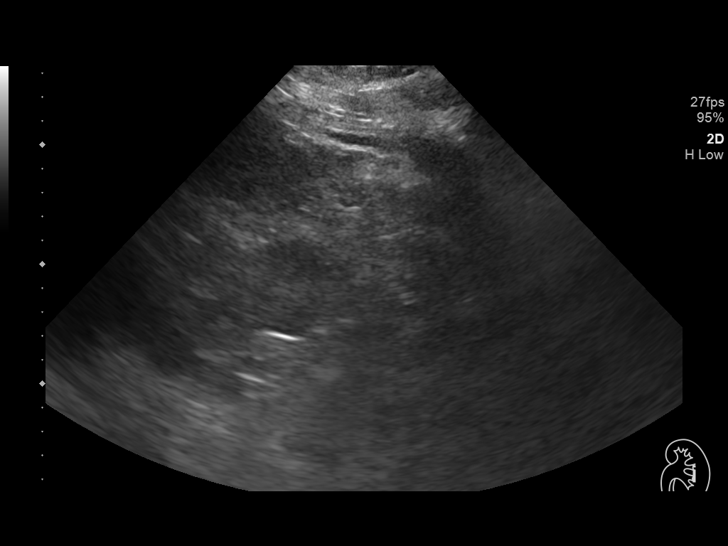
[im 111/111]
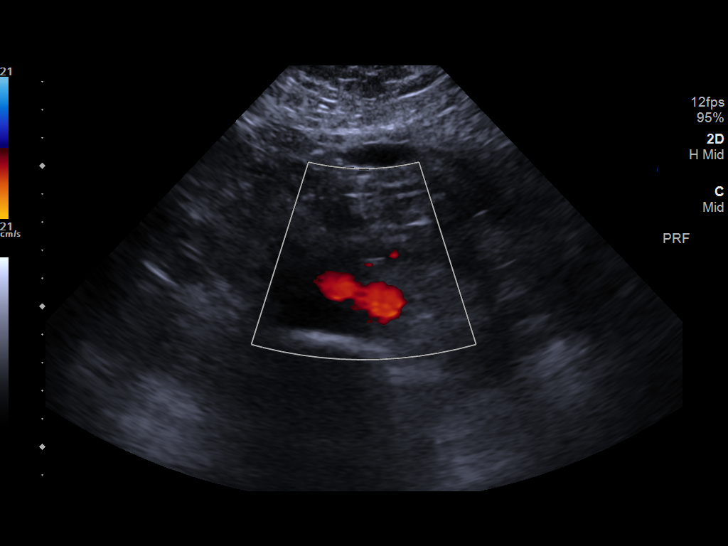

[14 of 25 positions shown; findings below may reference images not displayed]

FINDINGS: Gallbladder: No gallstones or wall thickening visualized. No
sonographic Murphy sign noted by sonographer.

Common bile duct: Diameter: 4.4 mm

Liver: No focal lesion identified. Increased parenchymal
echogenicity. Portal vein is patent on color Doppler imaging with
normal direction of blood flow towards the liver.

IVC: No abnormality visualized.

Pancreas: Visualized portion unremarkable.

Spleen: Size and appearance within normal limits.

Right Kidney: Length: 12.5 cm. Echogenicity within normal limits. No
mass or hydronephrosis visualized.

Left Kidney: Length: 13.6 cm. Echogenicity within normal limits. No
mass or hydronephrosis visualized.

Abdominal aorta: No aneurysm visualized.

Other findings: None.
IMPRESSION: Increased parenchymal echogenicity of the liver, usually associated
with hepatic steatosis or fibrosis.

Otherwise normal abdominal ultrasound.

## 2020-04-03 ENCOUNTER — Other Ambulatory Visit: Payer: Self-pay | Admitting: Physician Assistant

## 2020-04-03 DIAGNOSIS — I10 Essential (primary) hypertension: Secondary | ICD-10-CM

## 2020-04-28 ENCOUNTER — Other Ambulatory Visit: Payer: Self-pay | Admitting: Physician Assistant

## 2020-04-28 DIAGNOSIS — I10 Essential (primary) hypertension: Secondary | ICD-10-CM

## 2020-05-21 ENCOUNTER — Other Ambulatory Visit: Payer: Self-pay | Admitting: Physician Assistant

## 2020-05-26 ENCOUNTER — Other Ambulatory Visit (INDEPENDENT_AMBULATORY_CARE_PROVIDER_SITE_OTHER): Payer: Commercial Managed Care - PPO

## 2020-05-26 ENCOUNTER — Encounter: Payer: Self-pay | Admitting: Gastroenterology

## 2020-05-26 ENCOUNTER — Ambulatory Visit (INDEPENDENT_AMBULATORY_CARE_PROVIDER_SITE_OTHER): Payer: Commercial Managed Care - PPO | Admitting: Gastroenterology

## 2020-05-26 DIAGNOSIS — K529 Noninfective gastroenteritis and colitis, unspecified: Secondary | ICD-10-CM

## 2020-05-26 DIAGNOSIS — R197 Diarrhea, unspecified: Secondary | ICD-10-CM

## 2020-05-26 LAB — COMPREHENSIVE METABOLIC PANEL
ALT: 42 U/L (ref 0–53)
AST: 27 U/L (ref 0–37)
Albumin: 4.3 g/dL (ref 3.5–5.2)
Alkaline Phosphatase: 59 U/L (ref 39–117)
BUN: 14 mg/dL (ref 6–23)
CO2: 24 mEq/L (ref 19–32)
Calcium: 9.3 mg/dL (ref 8.4–10.5)
Chloride: 101 mEq/L (ref 96–112)
Creatinine, Ser: 0.93 mg/dL (ref 0.40–1.50)
GFR: 106.51 mL/min (ref >60.00)
Glucose, Bld: 99 mg/dL (ref 70–99)
Potassium: 4.2 mEq/L (ref 3.5–5.1)
Sodium: 135 mEq/L (ref 135–145)
Total Bilirubin: 0.5 mg/dL (ref 0.2–1.2)
Total Protein: 7.7 g/dL (ref 6.0–8.3)

## 2020-05-26 LAB — CBC
HCT: 43.3 % (ref 39.0–52.0)
Hemoglobin: 14.7 g/dL (ref 13.0–17.0)
MCHC: 33.9 g/dL (ref 30.0–36.0)
MCV: 84.5 fl (ref 78.0–100.0)
Platelets: 319 10*3/uL (ref 150.0–400.0)
RBC: 5.12 Mil/uL (ref 4.22–5.81)
RDW: 13.1 % (ref 11.5–15.5)
WBC: 11.5 10*3/uL — ABNORMAL HIGH (ref 4.0–10.5)

## 2020-05-26 LAB — SEDIMENTATION RATE: Sed Rate: 77 mm/hr — ABNORMAL HIGH (ref 0–15)

## 2020-05-26 LAB — C-REACTIVE PROTEIN: CRP: 7.9 mg/dL (ref 0.5–20.0)

## 2020-05-26 NOTE — Patient Instructions (Addendum)
If you are age 35 or older, your body mass index should be between 23-30. Your Body mass index is 42.92 kg/m. If this is out of the aforementioned range listed, please consider follow up with your Primary Care Provider.  If you are age 51 or younger, your body mass index should be between 19-25. Your Body mass index is 42.92 kg/m. If this is out of the aformentioned range listed, please consider follow up with your Primary Care Provider.   Your provider has requested that you go to the basement level for lab work before leaving today. Press "B" on the elevator. The lab is located at the first door on the left as you exit the elevator.  Due to recent changes in healthcare laws, you may see the results of your imaging and laboratory studies on MyChart before your provider has had a chance to review them.  We understand that in some cases there may be results that are confusing or concerning to you. Not all laboratory results come back in the same time frame and the provider may be waiting for multiple results in order to interpret others.  Please give [place] 48 hours in order for your provider to thoroughly review all the results before contacting the office for clarification of your results.   Please purchase the following medications over the counter and take as directed:  START: Imodium take one tablet daily.  Thank you for entrusting me with your care and choosing [facility].  Dr Ardis Hughs

## 2020-05-26 NOTE — Progress Notes (Signed)
Review of pertinent gastrointestinal problems: 1. Extensive ulcerative colitis:terrible bloody diarrhea, abd pains, nausea/vomting.diagnosed when admitted with GI bleeding 11/8/17through 05/24/16, colonoscopy showednormal ileum, severe inflammation from rectum to cecum Pathology revealed chronic active ulcerative colitis throughout the entire colon. Patient was started on Prednisone 20 mg twice a day and Lialda4 pills daily. He responded very well, tapered easily off the prednisone.  Flare 10/2018: Non-bloody diarrhea, urgency. GI path panel negative, sed rate 41, CRP 34. Started prednisone 58m daily.  Improved quickly on steroids.   2.Elevated liver tests likely from underlying fatty liver disease.AST 40s, ALT 90s. Blood work December 2019 shows normal iron studies, hepatitis B surface antigen was negative, hepatitis B surface antibody was negative hepatitis a total antibody was negative,hepatitis C antibody was negative,ANA, anti-smooth muscle antibody and alpha-1 antitrypsin were all negative, AMA was negative,ceruloplasmin celiac sprue testing was negative as well. Abdominal ultrasound December 2019 showed likely fatty liver, no obvious cirrhosis, no bili obstruction.   HPI: This is a very pleasant 34year old man  I last saw him here in the office a little over a year ago.  He was at that point completely recovered from a fairly minor flare.  Prednisone with a relatively quick taper helped rather quickly.  He has done very well since that visit until about 2 to 3 weeks ago.  He noticed a general loosening of his bowels and some urgency after eating.  He has developed some mild nausea as well.  He has seen no bleeding.  Normally prior to this he would move his bowels once daily.  For the past week or 2 he has gone after every meal about 3 times a day.  He has not been on antibiotics, he cannot point to any new medicine changes.  He did get his Covid booster around the time of symptom  onset.   ROS: complete GI ROS as described in HPI, all other review negative.  Constitutional:  No unintentional weight loss   Past Medical History:  Diagnosis Date  . Hypertension   . Panic anxiety syndrome     Past Surgical History:  Procedure Laterality Date  . COLONOSCOPY N/A 05/24/2016   Procedure: COLONOSCOPY;  Surgeon: DMilus Banister MD;  Location: [facility];  Service: Endoscopy;  Laterality: N/A;  . ESOPHAGOGASTRODUODENOSCOPY N/A 05/24/2016   Procedure: ESOPHAGOGASTRODUODENOSCOPY (EGD);  Surgeon: DMilus Banister MD;  [facility];  Service: Endoscopy;  Laterality: N/A;  . LUMBAR LAMINECTOMY/DECOMPRESSION MICRODISCECTOMY N/A 02/14/2016   Procedure: MICRO  LUMBER L4-5;  Surgeon: JSusa Day MD;  Location: [facility];  Service: Orthopedics;  Laterality: N/A;  . NO PAST SURGERIES      Current Outpatient Medications  Medication Sig Dispense Refill  . escitalopram (LEXAPRO) 20 MG tablet TAKE 1 TABLET BY MOUTH EVERY DAY 30 tablet 5  . lisinopril (ZESTRIL) 20 MG tablet TAKE 1 TABLET BY MOUTH EVERY DAY 30 tablet 0  . LORazepam (ATIVAN) 0.5 MG tablet Take 1 tablet (0.5 mg total) by mouth 2 (two) times daily as needed for anxiety. 30 tablet 0  . mesalamine (LIALDA) 1.2 g EC tablet Take 4 tablets (4.8 g total) by mouth daily. 120 tablet 11   No current facility-administered medications for this visit.    Allergies as of 05/26/2020 - Review Complete 05/26/2020  Allergen Reaction Noted  . Penicillins Hives 02/06/2016  . Sulfa antibiotics Other (See Comments) 02/06/2016    Family History  Problem Relation Age of Onset  . Hypertension Mother   . Diabetes Mother   .  Hyperlipidemia Mother   . Hypertension Father   . Diabetes Father   . Inflammatory bowel disease Other   . Diabetes Other   . High Cholesterol Maternal Grandmother   . Hypertension Maternal Grandmother   . Heart disease Maternal Grandfather   . High Cholesterol Maternal Grandfather   .  Hypertension Maternal Grandfather   . Stroke Maternal Grandfather   . Cancer Paternal Grandmother        unknown  . Diabetes Paternal Grandmother   . Heart disease Paternal Grandmother   . High Cholesterol Paternal Grandmother   . Hypertension Paternal Grandmother   . Heart disease Paternal Grandfather   . High Cholesterol Paternal Grandfather   . Hypertension Paternal Grandfather   . Stroke Paternal Grandfather   . Colon cancer Neg Hx   . Stomach cancer Neg Hx   . Esophageal cancer Neg Hx   . Pancreatic cancer Neg Hx   . Liver disease Neg Hx     Social History   Socioeconomic History  . Marital status: Married    Spouse name: Not on file  . Number of children: 0  . Years of education: Not on file  . Highest education level: Not on file  Occupational History  . Not on file  Tobacco Use  . Smoking status: Never Smoker  . Smokeless tobacco: Never Used  Vaping Use  . Vaping Use: Never used  Substance and Sexual Activity  . Alcohol use: Yes    Comment: occ  . Drug use: No  . Sexual activity: Yes    Partners: Female  Other Topics Concern  . 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 Difficulty of Paying Living Expenses: Not on file  Food Insecurity:   . Worried About Charity fundraiser in the Last Year: Not on file  . Ran Out of Food in the Last Year: Not on file  Transportation Needs:   . Lack of Transportation (Medical): Not on file  . Lack of Transportation (Non-Medical): Not on file  Physical Activity:   . Days of Exercise per Week: Not on file  . Minutes of Exercise per Session: Not on file  Stress:   . Feeling of Stress : Not on file  Social Connections:   . Frequency of Communication with Friends and Family: Not on file  . Frequency of Social Gatherings with Friends and Family: Not on file  . Attends Religious  Services: Not on file  . Active Member of Clubs or Organizations: Not on file  . Attends Archivist Meetings: Not on file  . Marital Status: Not on file  Intimate Partner Violence:   . Fear of Current or Ex-Partner: Not on file  . Emotionally Abused: Not on file  . Physically Abused: Not on file  . Sexually Abused: Not on file     Physical Exam: BP 118/82   Pulse 97   Ht 6' 3"  (1.905 m)   Wt (!) 343 lb 6.4 oz (155.8 kg)   SpO2 98%   BMI 42.92 kg/m  Constitutional: generally well-appearing except for morbid obesity Psychiatric: alert and oriented x3 Abdomen: soft, nontender, nondistended, no obvious ascites, no peritoneal signs, normal bowel sounds No peripheral edema noted in lower extremities  Assessment and plan: 34 y.o. male with known ulcerative colitis, now with diarrheal illness,  possible colitis flare  Unclear if this is a flare or perhaps mild infectious diarrheal illness.  He is going to get stool test and blood tests including CBC, complete metabolic profile, sedimentation rate, CRP, GI pathogen panel, fecal calprotectin.  He will start a single schedule Imodium once every morning.   We will contact him after the above tests are back, inquire about his symptoms at that point.  If he is still bothered despite the Imodium and there is no other clear etiology then I think we would have to assume this is a mild flare and I would start him on steroids.    Please see the "Patient Instructions" section for addition details about the plan.  [name], MD [facility] 05/26/2020, 9:01 AM   Total time on date of encounter was  30 minutes (this included time spent preparing to see the patient reviewing records; obtaining and/or reviewing separately obtained history; performing a medically appropriate exam and/or evaluation; counseling and educating the patient and family if present; ordering medications, tests or procedures if applicable; and  documenting clinical information in the health record).

## 2020-05-29 ENCOUNTER — Other Ambulatory Visit: Payer: Commercial Managed Care - PPO

## 2020-05-29 DIAGNOSIS — K529 Noninfective gastroenteritis and colitis, unspecified: Secondary | ICD-10-CM

## 2020-05-29 DIAGNOSIS — R197 Diarrhea, unspecified: Secondary | ICD-10-CM

## 2020-05-30 ENCOUNTER — Other Ambulatory Visit: Payer: Self-pay | Admitting: Physician Assistant

## 2020-05-30 DIAGNOSIS — I10 Essential (primary) hypertension: Secondary | ICD-10-CM

## 2020-05-30 LAB — FECAL LACTOFERRIN, QUANT
Fecal Lactoferrin: POSITIVE — AB
MICRO NUMBER:: 11203694
SPECIMEN QUALITY:: ADEQUATE

## 2020-05-31 ENCOUNTER — Telehealth: Payer: Self-pay | Admitting: Gastroenterology

## 2020-05-31 MED ORDER — PREDNISONE 10 MG PO TABS: 40.0000 mg | ORAL_TABLET | Freq: Every day | ORAL | 2 refills | Status: AC

## 2020-05-31 MED ORDER — ONDANSETRON HCL 4 MG PO TABS: 4.0000 mg | ORAL_TABLET | Freq: Three times a day (TID) | ORAL | 1 refills | Status: DC | PRN

## 2020-05-31 NOTE — Telephone Encounter (Signed)
Pt's spouse [place] is requesting a call back from a nurse to discuss the pt's symptoms, pt was last seen Friday 11/12 and his symptoms has worsened, pt is not able to keep anything down. Vomiting started this morning. Caller would like some advice on what to do.

## 2020-05-31 NOTE — Telephone Encounter (Signed)
The pt's wife states that the pt has worsened since he was seen on last Friday.  He has watery diarrhea up to 5 times daily.  He has nausea and vomiting and getting really weak.  His wife states that he is pale and not doing well.  I advised her that he should go to ED or urgent care for evaluation.  He can not keep any fluids down either.  Forwarded to Dr Ardis Hughs for further reqs

## 2020-05-31 NOTE — Telephone Encounter (Signed)
Pt's spouse is calling again to check on the status of the message.

## 2020-05-31 NOTE — Telephone Encounter (Signed)
I called in prednisone and zofran.  Cannot wait until his GI path panel is back, he's getting too ill and it is unlikely that he has an infection. 28m daily for now. 454mzofran TID PRN.  They know to push fluids.   Patty, Can you call him tomorrow mid morning, see how he is doing.

## 2020-06-01 ENCOUNTER — Telehealth: Payer: Self-pay | Admitting: Gastroenterology

## 2020-06-01 NOTE — Telephone Encounter (Signed)
Left message on machine to call back  

## 2020-06-01 NOTE — Telephone Encounter (Signed)
He feels overall much better this AM. Slept pretty well, no diarrhea, not tender when he palpates.  He is going to take another 72m pred this AM and then start daily AM 438mdosing.  Push fluids. Eat solid food as tolerated.  Still awaiting GI path panel but response to steroids is a good indication that this is colitis flare.  Patty, Can you check on him later this afternoon?  He also needs OV with me 2-3 weeks, double book if needed.

## 2020-06-01 NOTE — Telephone Encounter (Signed)
[name], MD  Timothy Lasso, RN   Please call the patient. Stool testing just came back, no infection.

## 2020-06-01 NOTE — Telephone Encounter (Signed)
The pt states he is doing very well today. He is able to eat and drink.  Appt made for 11/30 with Dr Ardis Hughs.

## 2020-06-02 LAB — GI PROFILE, STOOL, PCR

## 2020-06-02 LAB — CALPROTECTIN, FECAL: Calprotectin, Fecal: 812 ug/g — ABNORMAL HIGH (ref 0–120)

## 2020-06-05 ENCOUNTER — Telehealth: Payer: Self-pay | Admitting: Gastroenterology

## 2020-06-05 NOTE — Telephone Encounter (Signed)
Pt is wanting to inform the nurse that his medication prednisone is not working as it has in the past, pt states he is still experiencing nausea along with diarrhea.

## 2020-06-06 NOTE — Telephone Encounter (Signed)
His prolonged course probably means his colon was sicker with this flare than previous.  He should stay on current prednisone dose, should take zofran as needed.    Any fevers or chills or worsening abd pains?

## 2020-06-06 NOTE — Telephone Encounter (Signed)
The pt will stay on prednisone as prescribed and states he has no fever, chills, or worse abd pain.  He will take zofran for the nausea. He is aware to call with worse symptoms should they develop.

## 2020-06-06 NOTE — Telephone Encounter (Signed)
The pt started prednisone for colitis flare last week. He is currently on 40 mg once daily.  He has an appt next week but wants to keep Dr Ardis Hughs in the loop of how he is doing.   He states that it has helped some but he continues to have episodes of diarrhea (2-3 per day down about half) and nausea continues at night, especially when laying down. He wants to make sure that this is expected since the previous episodes he would have been back to normal after a week.  Please advise

## 2020-06-09 ENCOUNTER — Telehealth: Payer: Self-pay | Admitting: Gastroenterology

## 2020-06-09 NOTE — Telephone Encounter (Signed)
Received page to on-call.  Patient having ongoing nausea/vomiting and nonbloody diarrhea for the better part of the last month.  Was seen in the GI clinic last week.  GI PCR panel negative for infection, elevated WBC (11.2), elevated ESR (77), elevated fecal calprotectin (812) and positive fecal lactoferrin, consistent with flare.  CRP was normal.  CMP was normal.  He was started on prednisone 40 mg.    States he has had essentially no improvement.  No fever.  Not tolerating much in the way of p.o. intake.  They were actually calling from the [facility] ER parking lot and about to head and to the ER.  I told them that I think it is a good idea to be seen in the ER for labs, IV fluids, antiemetics, and if needed, could require hospital admission with IV Solu-Medrol.  Recommend stool studies, CBC, BMP, inflammatory markers.  Appreciative of call back and advise.

## 2020-06-13 ENCOUNTER — Telehealth: Payer: Self-pay

## 2020-06-13 ENCOUNTER — Other Ambulatory Visit: Payer: Commercial Managed Care - PPO

## 2020-06-13 ENCOUNTER — Ambulatory Visit (INDEPENDENT_AMBULATORY_CARE_PROVIDER_SITE_OTHER): Payer: Commercial Managed Care - PPO | Admitting: Gastroenterology

## 2020-06-13 ENCOUNTER — Encounter: Payer: Self-pay | Admitting: Gastroenterology

## 2020-06-13 VITALS — BP 100/66 | HR 125 | Ht 75.0 in | Wt 318.4 lb

## 2020-06-13 DIAGNOSIS — K529 Noninfective gastroenteritis and colitis, unspecified: Secondary | ICD-10-CM

## 2020-06-13 DIAGNOSIS — Z23 Encounter for immunization: Secondary | ICD-10-CM

## 2020-06-13 MED ORDER — HYOSCYAMINE SULFATE SL 0.125 MG SL SUBL: SUBLINGUAL_TABLET | SUBLINGUAL | 2 refills | Status: DC

## 2020-06-13 NOTE — Patient Instructions (Addendum)
If you are age 35 or older, your body mass index should be between 23-30. Your Body mass index is 39.8 kg/m. If this is out of the aforementioned range listed, please consider follow up with your Primary Care Provider.  If you are age 66 or younger, your body mass index should be between 19-25. Your Body mass index is 39.8 kg/m. If this is out of the aformentioned range listed, please consider follow up with your Primary Care Provider.   Your provider has requested that you go to the basement level for lab work before leaving today. Press "B" on the elevator. The lab is located at the first door on the left as you exit the elevator. TPMT enzyme activity level, hepatitis B surface antigen, hepatitis B surface antibody, hepatitis a total antibody.  Vaccination: prevnar 23 today; will vaccinate for hepatitis A and B if needed based on lab tests above  New start Remicade at 5 mg/kg dosing, week 0, week 2, week 6 and then every 8 weeks after that  Will start Imuran at 200 mg daily pending results of TPMT activity level above.  We have sent the following medications to your pharmacy for you to pick up at your convenience:  New prescription for dicyclomine 0.125 mg sublingual tabs take 1-2 tabs twice daily as needed for crampy pains  Please drink 2 carnation breakfast drinks each day.  Please increase fluid intake daily.  You should drink 6 to 8, 8 ounce glasses of water each day.  You will follow up with Dr Ardis Hughs on 07-10-20 at 9:10am.  Thank you for entrusting me with your care and choosing [facility].  Dr Ardis Hughs

## 2020-06-13 NOTE — Telephone Encounter (Signed)
-----   Message from Stevan Born, [place] sent at 06/13/2020 10:03 AM EST ----- Regarding: New start Remicade Dr Ardis Hughs would like this patient to be started on Remicade 35m/kg 0-2-6- then every 8 weeks.  He would like him to be started on this within the next two weeks or the sooner the better.  Thank you, NElmyra Ricks

## 2020-06-13 NOTE — Progress Notes (Signed)
Review of pertinent gastrointestinal problems: 1. Extensive ulcerative colitis:terrible bloody diarrhea, abd pains, nausea/vomting.diagnosed when admitted with GI bleeding 11/8/17through 05/24/16, colonoscopy showednormal ileum, severe inflammation from rectum to cecum Pathology revealed chronic active ulcerative colitis throughout the entire colon. Patient was started on Prednisone 20 mg twice a day and Lialda4 pills daily. He responded very well, tapered easily off the prednisone.  Flare 10/2018: Non-bloody diarrhea, urgency. GI path panel negative, sed rate 41, CRP 34. Started prednisone 76m daily.Improved quickly on steroids.  Flare 05/2020 bloody diarrhea, urgency.  GI path panel negative, sed rate elevated, CRP elevated start prednisone 40 mg daily however [facility] continued to decline [facility].  While there he had CT enterography " mild wall thickening in the proximal transverse colon also the mid descending colon.  There is mild edema adjacent to the mid descending colon." 2.Elevated liver tests likely from underlying fatty liver disease.AST 40s, ALT 90s. Blood work December 2019 shows normal iron studies, hepatitis B surface antigen was negative, hepatitis B surface antibody was negative hepatitis a total antibody was negative,hepatitis C antibody was negative,ANA, anti-smooth muscle antibody and alpha-1 antitrypsin were all negative, AMA was negative,ceruloplasmin celiac sprue testing was negative as well. Abdominal ultrasound December 2019 showed likely fatty liver, no obvious cirrhosis, no bili obstruction.   HPI: This is a very pleasant 34year old man whom I last saw about 2 or 3 weeks ago when he was here in the office.  I started him on prednisone however it seems that it was probably too little too late [place]  [place] was just discharged yesterday morning.   He received IV steroids and did improve while he was there but is still clearly not back to normal.  He has lost 25 pounds.  He is not eating well.  He is trying to stay up on his hydration but finds it hard.  He has having diarrhea for or 5 times daily with intermittent bleeding.  He is having intermittent gas-like crampy pains in his mid abdomen.  ROS: complete GI ROS as described in HPI, all other review negative.  Constitutional: He has lost 25 pounds since his last visit here about 2 weeks ago.   Past Medical History:  Diagnosis Date  . Colitis   . Hypertension   . Panic anxiety syndrome     Past Surgical History:  Procedure Laterality Date  . COLONOSCOPY N/A 05/24/2016   Procedure: COLONOSCOPY;  Surgeon: DMilus Banister MD;  Location: [facility];  Service: Endoscopy;  Laterality: N/A;  . ESOPHAGOGASTRODUODENOSCOPY N/A 05/24/2016   Procedure: ESOPHAGOGASTRODUODENOSCOPY (EGD);  Surgeon: DMilus Banister MD;  [facility];  Service: Endoscopy;  Laterality: N/A;  . LUMBAR LAMINECTOMY/DECOMPRESSION MICRODISCECTOMY N/A 02/14/2016   Procedure: MICRO  LUMBER L4-5;  Surgeon: JSusa Day MD;  Location: [facility];  Service: Orthopedics;  Laterality: N/A;    Current Outpatient Medications  Medication Sig Dispense Refill  . escitalopram (LEXAPRO) 20 MG tablet TAKE 1 TABLET BY MOUTH EVERY DAY 30 tablet 5  . lisinopril (ZESTRIL) 20 MG tablet TAKE 1 TABLET BY MOUTH EVERY DAY 30 tablet 0  . LORazepam (ATIVAN) 0.5 MG tablet Take 1 tablet (0.5 mg total) by mouth 2 (two) times daily as needed for anxiety. 30 tablet 0  . ondansetron (ZOFRAN) 4 MG tablet Take 1 tablet (4 mg total) by mouth every 8 (eight) hours as needed for nausea or vomiting. [place]  tablet 1  . predniSONE (DELTASONE) 10 MG tablet Take 4 tablets (40 mg total) by mouth daily. 120 tablet 2  . mesalamine (LIALDA) 1.2 g EC tablet Take 4 tablets (4.8 g total) by mouth daily. (Patient not taking: Reported on 06/13/2020) 120 tablet  11   No current facility-administered medications for this visit.    Allergies as of 06/13/2020 - Review Complete 06/13/2020  Allergen Reaction Noted  . Penicillins Hives 02/06/2016  . Sulfa antibiotics Other (See Comments) 02/06/2016    Family History  Problem Relation Age of Onset  . Hypertension Mother   . Diabetes Mother   . Hyperlipidemia Mother   . Hypertension Father   . Diabetes Father   . High Cholesterol Maternal Grandmother   . Hypertension Maternal Grandmother   . Crohn's disease Maternal Grandmother   . Heart disease Maternal Grandfather   . High Cholesterol Maternal Grandfather   . Hypertension Maternal Grandfather   . Stroke Maternal Grandfather   . Cancer Paternal Grandmother        unknown  . Diabetes Paternal Grandmother   . Heart disease Paternal Grandmother   . High Cholesterol Paternal Grandmother   . Hypertension Paternal Grandmother   . Heart disease Paternal Grandfather   . High Cholesterol Paternal Grandfather   . Hypertension Paternal Grandfather   . Stroke Paternal Grandfather   . Colon cancer Neg Hx   . Stomach cancer Neg Hx   . Esophageal cancer Neg Hx   . Pancreatic cancer Neg Hx   . Liver disease Neg Hx     Social History   Socioeconomic History  . Marital status: Married    Spouse name: Not on file  . Number of children: 0  . Years of education: Not on file  . Highest education level: Not on file  Occupational History  . Not on file  Tobacco Use  . Smoking status: Never Smoker  . Smokeless tobacco: Never Used  Vaping Use  . Vaping Use: Never used  Substance and Sexual Activity  . Alcohol use: Yes    Comment: occ  . Drug use: No  . Sexual activity: Yes    Partners: Female  Other Topics Concern  . 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 Difficulty of Paying Living Expenses: Not  on file  Food Insecurity:   . Worried About Charity fundraiser in the Last Year: Not on file  . Ran Out of Food in the Last Year: Not on file  Transportation Needs:   . Lack of Transportation (Medical): Not on file  . Lack of Transportation (Non-Medical): Not on file  Physical Activity:   . Days of Exercise per Week: Not on file  . Minutes of Exercise per Session: Not on file  Stress:   . Feeling of Stress : Not on file  Social Connections:   . Frequency of Communication with Friends and Family: Not on file  . Frequency of Social Gatherings with Friends and Family: Not on file  . Attends Religious Services: Not on file  . Active Member of Clubs or Organizations: Not on file  . Attends Archivist Meetings: Not on file  . Marital Status: Not on file  Intimate Partner Violence:   . Fear of Current or Ex-Partner: Not on file  . Emotionally Abused: Not  on file  . Physically Abused: Not on file  . Sexually Abused: Not on file    Physical Exam: BP 100/66   Pulse (!) 125   Ht 6' 3"  (1.905 m)   Wt (!) 318 lb 6.4 oz (144.4 kg)   SpO2 96%   BMI 39.80 kg/m  Constitutional: generally well-appearing Psychiatric: alert and oriented x3 Abdomen: soft, nontender, nondistended, no obvious ascites, no peritoneal signs, normal bowel sounds No peripheral edema noted in lower extremities  Assessment and plan: 34 y.o. male with extensive, severe ulcerative colitis flare  We discussed that his mesalamine orally was clearly not maintaining him well enough as evidenced by this severe flare.  I recommended we increase his medicine coverage to biologic and also immune modulator.  I recommended Remicade at usual 5 mg/kg dosing as well as Imuran 200 mg daily as long as his TPMT enzyme activity level is appropriate.  He already had a flu shot this year and is up-to-date on Covid vaccinations.  We are giving him a Prevnar 23 vaccination today.  I am testing his hepatitis serologies and we will  immunize him for hepatitis a and B as appropriate.  My goal will be to get him on first infusion Remicade within the next 1 to 2 weeks as well as immunomodulator.  He is having some crampy abdominal pains and I am calling in dicyclomine sublingual 0.125 mg tabs.  I impressed upon him the need for nutrition and hydration.  I recommended specifically that he try to keep down to [employer] daily as well as a lot of water and other fluids.  He will return to see me in 4 weeks, will double book if needed, sooner if needed as well.  Please see the "Patient Instructions" section for addition details about the plan.  [name], MD [facility] 06/13/2020, 9:16 AM   Total time on date of encounter was 40 minutes (this included time spent preparing to see the patient reviewing records; obtaining and/or reviewing separately obtained history; performing a medically appropriate exam and/or evaluation; counseling and educating the patient and family if present; ordering medications, tests or procedures if applicable; and documenting clinical information in the health record).

## 2020-06-14 NOTE — Telephone Encounter (Signed)
The pt will come in tomorrow for quant gold and aware to call our office in 1 week if he has not heard from [facility]. Records were faxed to [facility] with urgent request.

## 2020-06-14 NOTE — Telephone Encounter (Signed)
Dr Ardis Hughs I do not see Percival Spanish.  Do you want me to order?

## 2020-06-14 NOTE — Telephone Encounter (Signed)
I have added the order and faxed a referral to [facility] for Remicade,

## 2020-06-14 NOTE — Telephone Encounter (Signed)
Please send to [facility] due to his insurance. Thank you!

## 2020-06-14 NOTE — Telephone Encounter (Signed)
Amy can you help with this?

## 2020-06-14 NOTE — Telephone Encounter (Signed)
Yes, thanks for catching that!

## 2020-06-15 ENCOUNTER — Other Ambulatory Visit: Payer: Commercial Managed Care - PPO

## 2020-06-15 DIAGNOSIS — K529 Noninfective gastroenteritis and colitis, unspecified: Secondary | ICD-10-CM

## 2020-06-16 ENCOUNTER — Other Ambulatory Visit: Payer: Self-pay

## 2020-06-17 LAB — QUANTIFERON-TB GOLD PLUS
Mitogen-NIL: 1.02 IU/mL
NIL: 0.03 IU/mL
QuantiFERON-TB Gold Plus: NEGATIVE
TB1-NIL: 0 IU/mL
TB2-NIL: 0 IU/mL

## 2020-06-18 ENCOUNTER — Other Ambulatory Visit: Payer: Self-pay | Admitting: Family Medicine

## 2020-06-18 LAB — HEPATITIS B SURFACE ANTIBODY,QUALITATIVE: Hep B S Ab: NONREACTIVE

## 2020-06-18 LAB — HEPATITIS B SURFACE ANTIGEN: Hepatitis B Surface Ag: NONREACTIVE

## 2020-06-18 LAB — HEPATITIS A ANTIBODY, TOTAL: Hepatitis A AB,Total: BORDERLINE — AB

## 2020-06-18 LAB — THIOPURINE METHYLTRANSFERASE (TPMT), RBC: Thiopurine Methyltransferase, RBC: 12 nmol/hr/mL RBC — ABNORMAL LOW

## 2020-06-19 ENCOUNTER — Ambulatory Visit (INDEPENDENT_AMBULATORY_CARE_PROVIDER_SITE_OTHER): Payer: Commercial Managed Care - PPO | Admitting: Gastroenterology

## 2020-06-19 DIAGNOSIS — Z23 Encounter for immunization: Secondary | ICD-10-CM

## 2020-06-20 ENCOUNTER — Other Ambulatory Visit: Payer: Self-pay

## 2020-06-20 ENCOUNTER — Other Ambulatory Visit: Payer: Self-pay | Admitting: Gastroenterology

## 2020-06-20 ENCOUNTER — Telehealth: Payer: Self-pay | Admitting: Gastroenterology

## 2020-06-20 DIAGNOSIS — K5100 Ulcerative (chronic) pancolitis without complications: Secondary | ICD-10-CM

## 2020-06-20 DIAGNOSIS — Z79899 Other long term (current) drug therapy: Secondary | ICD-10-CM

## 2020-06-20 MED ORDER — AZATHIOPRINE 100 MG PO TABS: 100.0000 mg | ORAL_TABLET | Freq: Every day | ORAL | 11 refills | Status: DC

## 2020-06-20 NOTE — Telephone Encounter (Signed)
Pts insurance will not cover Remicade. They will cover Inflectra. Please advise if ok and new Plan of Treatment can be faxed over to [facility].

## 2020-06-20 NOTE — Telephone Encounter (Signed)
New plan of care faxed back to [facility] for Inflectra along with requested Quant gold and Hep b surface ag.

## 2020-06-20 NOTE — Telephone Encounter (Signed)
Yes, inflectra is the same, just a different commercial name.  Inflximab is the generic name for both. It is ok to use.  Same sig 6m/kg on week 0, 2, 6 and then every 8 weeks.  Thanks

## 2020-06-21 ENCOUNTER — Other Ambulatory Visit: Payer: Self-pay

## 2020-06-21 MED ORDER — MESALAMINE 1.2 G PO TBEC
4.8000 g | DELAYED_RELEASE_TABLET | Freq: Every day | ORAL | 11 refills | Status: DC
Start: 2020-06-21 — End: 2020-07-18

## 2020-06-22 ENCOUNTER — Other Ambulatory Visit: Payer: Self-pay | Admitting: Physician Assistant

## 2020-06-22 DIAGNOSIS — I10 Essential (primary) hypertension: Secondary | ICD-10-CM

## 2020-06-26 ENCOUNTER — Ambulatory Visit (INDEPENDENT_AMBULATORY_CARE_PROVIDER_SITE_OTHER): Payer: Commercial Managed Care - PPO | Admitting: Gastroenterology

## 2020-06-26 DIAGNOSIS — Z23 Encounter for immunization: Secondary | ICD-10-CM

## 2020-07-04 ENCOUNTER — Other Ambulatory Visit (INDEPENDENT_AMBULATORY_CARE_PROVIDER_SITE_OTHER): Payer: Commercial Managed Care - PPO

## 2020-07-04 DIAGNOSIS — Z79899 Other long term (current) drug therapy: Secondary | ICD-10-CM | POA: Diagnosis not present

## 2020-07-04 DIAGNOSIS — K5100 Ulcerative (chronic) pancolitis without complications: Secondary | ICD-10-CM

## 2020-07-04 LAB — BASIC METABOLIC PANEL
BUN: 17 mg/dL (ref 6–23)
CO2: 28 mEq/L (ref 19–32)
Calcium: 8.7 mg/dL (ref 8.4–10.5)
Chloride: 102 mEq/L (ref 96–112)
Creatinine, Ser: 0.97 mg/dL (ref 0.40–1.50)
GFR: 101.19 mL/min (ref >60.00)
Glucose, Bld: 164 mg/dL — ABNORMAL HIGH (ref 70–99)
Potassium: 3.9 mEq/L (ref 3.5–5.1)
Sodium: 138 mEq/L (ref 135–145)

## 2020-07-10 ENCOUNTER — Ambulatory Visit: Payer: Commercial Managed Care - PPO | Admitting: Gastroenterology

## 2020-07-13 ENCOUNTER — Telehealth: Payer: Self-pay | Admitting: Gastroenterology

## 2020-07-13 NOTE — Telephone Encounter (Signed)
Pt is requesting a call back from a nurse regarding his lab results.

## 2020-07-13 NOTE — Telephone Encounter (Signed)
Please call the patient. His blood work from last week looked just fine. I hope he has started his Remicade already and also his azathioprine at 100 mg once daily. Can you please confirm that with him? I will discuss all this further with him next week at his office visit.     The pt has been advised and states he has started Remicade (this past Monday) and has been on Azathioprine for several weeks. He will keep upcoming appt.

## 2020-07-18 ENCOUNTER — Ambulatory Visit (INDEPENDENT_AMBULATORY_CARE_PROVIDER_SITE_OTHER): Payer: Commercial Managed Care - PPO | Admitting: Gastroenterology

## 2020-07-18 ENCOUNTER — Encounter: Payer: Self-pay | Admitting: Gastroenterology

## 2020-07-18 ENCOUNTER — Other Ambulatory Visit (INDEPENDENT_AMBULATORY_CARE_PROVIDER_SITE_OTHER): Payer: Commercial Managed Care - PPO

## 2020-07-18 VITALS — BP 128/70 | HR 68 | Ht 75.0 in | Wt 329.2 lb

## 2020-07-18 DIAGNOSIS — Z23 Encounter for immunization: Secondary | ICD-10-CM

## 2020-07-18 DIAGNOSIS — K5100 Ulcerative (chronic) pancolitis without complications: Secondary | ICD-10-CM

## 2020-07-18 LAB — COMPREHENSIVE METABOLIC PANEL
ALT: 36 U/L (ref 0–53)
AST: 15 U/L (ref 0–37)
Albumin: 4.3 g/dL (ref 3.5–5.2)
Alkaline Phosphatase: 54 U/L (ref 39–117)
BUN: 19 mg/dL (ref 6–23)
CO2: 27 mEq/L (ref 19–32)
Calcium: 9.5 mg/dL (ref 8.4–10.5)
Chloride: 103 mEq/L (ref 96–112)
Creatinine, Ser: 0.91 mg/dL (ref 0.40–1.50)
GFR: 109.22 mL/min (ref >60.00)
Glucose, Bld: 144 mg/dL — ABNORMAL HIGH (ref 70–99)
Potassium: 4.2 mEq/L (ref 3.5–5.1)
Sodium: 139 mEq/L (ref 135–145)
Total Bilirubin: 0.4 mg/dL (ref 0.2–1.2)
Total Protein: 6.7 g/dL (ref 6.0–8.3)

## 2020-07-18 NOTE — Patient Instructions (Addendum)
If you are age 36 or older, your body mass index should be between 23-30. Your Body mass index is 41.15 kg/m. If this is out of the aforementioned range listed, please consider follow up with your Primary Care Provider.  If you are age 62 or younger, your body mass index should be between 19-25. Your Body mass index is 41.15 kg/m. If this is out of the aformentioned range listed, please consider follow up with your Primary Care Provider.   Your provider has requested that you go to the basement level for lab work before leaving today. Press "B" on the elevator. The lab is located at the first door on the left as you exit the elevator.  Due to recent changes in healthcare laws, you may see the results of your imaging and laboratory studies on MyChart before your provider has had a chance to review them.  We understand that in some cases there may be results that are confusing or concerning to you. Not all laboratory results come back in the same time frame and the provider may be waiting for multiple results in order to interpret others.  Please give [place] 48 hours in order for your provider to thoroughly review all the results before contacting the office for clarification of your results.   Start tapering off of prednisone as discussed so that you are completely off one month from now.  Continue Azathioprine  Continue Remicade infusions  You are scheduled for follow up on 08-30-2020 @ 930am.  Thank you for entrusting me with your care and choosing [facility].  Dr Ardis Hughs

## 2020-07-18 NOTE — Addendum Note (Signed)
Addended by: Stevan Born on: 07/18/2020 09:54 AM   Modules accepted: Orders

## 2020-07-18 NOTE — Progress Notes (Signed)
Review of pertinent gastrointestinal problems: 1. Extensive ulcerative colitis:terrible bloody diarrhea, abd pains, nausea/vomting.diagnosed when admitted with GI bleeding 11/8/17through 05/24/16, colonoscopy showednormal ileum, severe inflammation from rectum to cecum Pathology revealed chronic active ulcerative colitis throughout the entire colon. Patient was started on Prednisone 20 mg twice a day and Lialda4 pills daily. He responded very well, tapered easily off the prednisone.  Flare 10/2018: Non-bloody diarrhea, urgency. GI path panel negative, sed rate 41, CRP 34. Started prednisone 80m daily.Improved quickly on steroids.  Flare 05/2020 bloody diarrhea, urgency.  GI path panel negative, sed rate elevated, CRP elevated start prednisone 40 mg daily however he continued to decline [facility].  While [facility] he had CT enterography " mild wall thickening in the proximal transverse colon also the mid descending colon.  There is mild edema adjacent to the mid descending colon."  Quant TB Gold - December 2021  TPMT enzyme activity December 2021 12 (low normal), hepatitis B surface antigen and antibody - December 2021  Started Remicade at 5 mg/kg and azathioprine 100 mg once daily December 2021 2.Elevated liver tests likely from underlying fatty liver disease.AST 40s, ALT 90s. Blood work December 2019 shows normal iron studies, hepatitis B surface antigen was negative, hepatitis B surface antibody was negative hepatitis a total antibody was negative,hepatitis C antibody was negative,ANA, anti-smooth muscle antibody and alpha-1 antitrypsin were all negative, AMA was negative,ceruloplasmin celiac sprue testing was negative as well. Abdominal ultrasound December 2019 showed likely fatty liver, no obvious cirrhosis, no bili obstruction.  HPI: This is a very pleasant 35 year old man whom I last saw about 6 weeks ago.  Since that  appointment he has started azathioprine and Remicade.  He had his first Remicade infusion at 5 mg/kg 8 days ago.  He started azathioprine at 100 mg once daily 3 weeks ago.  He noticed his colitis symptoms are improving shortly after starting the azathioprine and he is actually completely back to normal as of 2 or 3 days after his first Remicade infusion.  Normal for him is a BM once daily after his breakfast meal.  It is solid and nonbloody.  He has had no abdominal pains.  He really feels much better than several weeks ago   ROS: complete GI ROS as described in HPI, all other review negative.  Constitutional:  No unintentional weight loss   Past Medical History:  Diagnosis Date  . Colitis   . Hypertension   . Panic anxiety syndrome     Past Surgical History:  Procedure Laterality Date  . COLONOSCOPY N/A 05/24/2016   Procedure: COLONOSCOPY;  Surgeon: DMilus Banister MD;  Location: [facility];  Service: Endoscopy;  Laterality: N/A;  . ESOPHAGOGASTRODUODENOSCOPY N/A 05/24/2016   Procedure: ESOPHAGOGASTRODUODENOSCOPY (EGD);  Surgeon: DMilus Banister MD;  [facility];  Service: Endoscopy;  Laterality: N/A;  . LUMBAR LAMINECTOMY/DECOMPRESSION MICRODISCECTOMY N/A 02/14/2016   Procedure: MICRO  LUMBER L4-5;  Surgeon: JSusa Day MD;  Location: [facility];  Service: Orthopedics;  Laterality: N/A;    Current Outpatient Medications  Medication Sig Dispense Refill  . azathioprine (IMURAN) 100 MG tablet Take 1 tablet (100 mg total) by mouth daily. 30 tablet 11  . escitalopram (LEXAPRO) 20 MG tablet TAKE 1 TABLET BY MOUTH EVERY DAY 90 tablet 2  . inFLIXimab-dyyb (INFLECTRA) 100 MG SOLR Inject into the vein.    .Marland Kitchenlisinopril (ZESTRIL) 20 MG tablet TAKE 1 TABLET BY MOUTH EVERY DAY 30 tablet 0  .  LORazepam (ATIVAN) 0.5 MG tablet Take 1 tablet (0.5 mg total) by mouth 2 (two) times daily as needed for anxiety. 30 tablet 0   No current facility-administered medications for this visit.     Allergies as of 07/18/2020 - Review Complete 07/18/2020  Allergen Reaction Noted  . Penicillins Hives 02/06/2016  . Sulfa antibiotics Other (See Comments) 02/06/2016    Family History  Problem Relation Age of Onset  . Hypertension Mother   . Diabetes Mother   . Hyperlipidemia Mother   . Hypertension Father   . Diabetes Father   . High Cholesterol Maternal Grandmother   . Hypertension Maternal Grandmother   . Crohn's disease Maternal Grandmother   . Heart disease Maternal Grandfather   . High Cholesterol Maternal Grandfather   . Hypertension Maternal Grandfather   . Stroke Maternal Grandfather   . Cancer Paternal Grandmother        unknown  . Diabetes Paternal Grandmother   . Heart disease Paternal Grandmother   . High Cholesterol Paternal Grandmother   . Hypertension Paternal Grandmother   . Heart disease Paternal Grandfather   . High Cholesterol Paternal Grandfather   . Hypertension Paternal Grandfather   . Stroke Paternal Grandfather   . Colon cancer Neg Hx   . Stomach cancer Neg Hx   . Esophageal cancer Neg Hx   . Pancreatic cancer Neg Hx   . Liver disease Neg Hx     Social History   Socioeconomic History  . Marital status: Married    Spouse name: Not on file  . Number of children: 0  . Years of education: Not on file  . Highest education level: Not on file  Occupational History  . Not on file  Tobacco Use  . Smoking status: Never Smoker  . Smokeless tobacco: Never Used  Vaping Use  . Vaping Use: Never used  Substance and Sexual Activity  . Alcohol use: Yes    Comment: occ  . Drug use: No  . Sexual activity: Yes    Partners: Female  Other Topics Concern  . 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, no children   Fun: golf   Social Determinants of Health   Financial Resource Strain: Not on file  Food Insecurity: Not on file  Transportation Needs: Not on file  Physical Activity: Not on file   Stress: Not on file  Social Connections: Not on file  Intimate Partner Violence: Not on file     Physical Exam: BP 128/70   Pulse 68   Ht _0  (1.905 m)   Wt (!) 329 lb 3.2 oz (149.3 kg)   BMI 41.15 kg/m  Constitutional: generally well-appearing Psychiatric: alert and oriented x3 Abdomen: soft, nontender, nondistended, no obvious ascites, no peritoneal signs, normal bowel sounds No peripheral edema noted in lower extremities  Assessment and plan: 35 y.o. male with extensive ulcerative colitis  His symptoms are clearly improving.  I suspect being on prednisone for so long has been part of the reason.  New start immunomodulators and Biologics are also probably beginning to have an effect as well.  He is currently on prednisone 40 mg once daily and knows to start tapering off so that he is completely off of prednisone in 1 month from now.  He will continue his Remicade infusions, his week 2 dose is next Monday.  He will continue on azathioprine at its current dose which is probably low for him  but since he is improving so well already I am not inclined to adjust it just yet.  He will return to see me in 6 weeks.  He will get a c-Met today.  Please see the "Patient Instructions" section for addition details about the plan.  [name], MD [facility] 07/18/2020, 9:12 AM   Total time on date of encounter was 35 minutes (this included time spent preparing to see the patient reviewing records; obtaining and/or reviewing separately obtained history; performing a medically appropriate exam and/or evaluation; counseling and educating the patient and family if present; ordering medications, tests or procedures if applicable; and documenting clinical information in the health record).

## 2020-07-26 ENCOUNTER — Other Ambulatory Visit: Payer: Self-pay | Admitting: Physician Assistant

## 2020-07-26 DIAGNOSIS — I10 Essential (primary) hypertension: Secondary | ICD-10-CM

## 2020-08-30 ENCOUNTER — Ambulatory Visit: Payer: Commercial Managed Care - PPO | Admitting: Gastroenterology

## 2021-03-07 ENCOUNTER — Other Ambulatory Visit: Payer: Self-pay | Admitting: Physician Assistant

## 2021-06-12 ENCOUNTER — Other Ambulatory Visit: Payer: Self-pay | Admitting: Gastroenterology

## 2021-06-12 DIAGNOSIS — Z79899 Other long term (current) drug therapy: Secondary | ICD-10-CM

## 2021-06-12 DIAGNOSIS — K5100 Ulcerative (chronic) pancolitis without complications: Secondary | ICD-10-CM

## 2021-06-12 DIAGNOSIS — Z7960 Long term (current) use of unspecified immunomodulators and immunosuppressants: Secondary | ICD-10-CM

## 2021-08-07 ENCOUNTER — Other Ambulatory Visit: Payer: Self-pay | Admitting: Gastroenterology

## 2021-08-07 DIAGNOSIS — Z7960 Long term (current) use of unspecified immunomodulators and immunosuppressants: Secondary | ICD-10-CM

## 2021-08-07 DIAGNOSIS — Z79899 Other long term (current) drug therapy: Secondary | ICD-10-CM

## 2021-08-07 DIAGNOSIS — K5100 Ulcerative (chronic) pancolitis without complications: Secondary | ICD-10-CM

## 2021-09-02 ENCOUNTER — Other Ambulatory Visit: Payer: Self-pay | Admitting: Gastroenterology

## 2021-09-02 DIAGNOSIS — K5100 Ulcerative (chronic) pancolitis without complications: Secondary | ICD-10-CM

## 2021-09-02 DIAGNOSIS — Z79899 Other long term (current) drug therapy: Secondary | ICD-10-CM

## 2021-10-01 ENCOUNTER — Other Ambulatory Visit: Payer: Self-pay | Admitting: Gastroenterology

## 2021-10-01 DIAGNOSIS — Z79899 Other long term (current) drug therapy: Secondary | ICD-10-CM

## 2021-10-01 DIAGNOSIS — K5100 Ulcerative (chronic) pancolitis without complications: Secondary | ICD-10-CM

## 2021-10-30 ENCOUNTER — Other Ambulatory Visit: Payer: Self-pay | Admitting: Gastroenterology

## 2021-10-30 DIAGNOSIS — Z79899 Other long term (current) drug therapy: Secondary | ICD-10-CM

## 2021-10-30 DIAGNOSIS — K5100 Ulcerative (chronic) pancolitis without complications: Secondary | ICD-10-CM

## 2021-11-23 ENCOUNTER — Other Ambulatory Visit: Payer: Self-pay | Admitting: Gastroenterology

## 2021-11-23 DIAGNOSIS — Z79899 Other long term (current) drug therapy: Secondary | ICD-10-CM

## 2021-11-23 DIAGNOSIS — K5100 Ulcerative (chronic) pancolitis without complications: Secondary | ICD-10-CM

## 2021-12-11 ENCOUNTER — Ambulatory Visit (INDEPENDENT_AMBULATORY_CARE_PROVIDER_SITE_OTHER): Payer: Commercial Managed Care - PPO | Admitting: Gastroenterology

## 2021-12-11 ENCOUNTER — Encounter: Payer: Self-pay | Admitting: Gastroenterology

## 2021-12-11 ENCOUNTER — Other Ambulatory Visit (INDEPENDENT_AMBULATORY_CARE_PROVIDER_SITE_OTHER): Payer: Commercial Managed Care - PPO

## 2021-12-11 VITALS — BP 130/76 | HR 98 | Ht 75.0 in | Wt 362.4 lb

## 2021-12-11 DIAGNOSIS — K5100 Ulcerative (chronic) pancolitis without complications: Secondary | ICD-10-CM

## 2021-12-11 LAB — COMPREHENSIVE METABOLIC PANEL
ALT: 53 U/L (ref 0–53)
AST: 37 U/L (ref 0–37)
Albumin: 4.7 g/dL (ref 3.5–5.2)
Alkaline Phosphatase: 60 U/L (ref 39–117)
BUN: 15 mg/dL (ref 6–23)
CO2: 26 mEq/L (ref 19–32)
Calcium: 10 mg/dL (ref 8.4–10.5)
Chloride: 100 mEq/L (ref 96–112)
Creatinine, Ser: 0.94 mg/dL (ref 0.40–1.50)
GFR: 104.02 mL/min (ref >60.00)
Glucose, Bld: 133 mg/dL — ABNORMAL HIGH (ref 70–99)
Potassium: 4 mEq/L (ref 3.5–5.1)
Sodium: 136 mEq/L (ref 135–145)
Total Bilirubin: 0.6 mg/dL (ref 0.2–1.2)
Total Protein: 7.2 g/dL (ref 6.0–8.3)

## 2021-12-11 LAB — CBC
HCT: 44.5 % (ref 39.0–52.0)
Hemoglobin: 15.6 g/dL (ref 13.0–17.0)
MCHC: 35.2 g/dL (ref 30.0–36.0)
MCV: 87.5 fl (ref 78.0–100.0)
Platelets: 285 10*3/uL (ref 150.0–400.0)
RBC: 5.09 Mil/uL (ref 4.22–5.81)
RDW: 12.7 % (ref 11.5–15.5)
WBC: 7.5 10*3/uL (ref 4.0–10.5)

## 2021-12-11 NOTE — Patient Instructions (Signed)
If you are age 37 or younger, your body mass index should be between 19-25. Your Body mass index is 45.29 kg/m. If this is out of the aformentioned range listed, please consider follow up with your Primary Care Provider.  ________________________________________________________  The [place] [facility] providers would like to encourage you to use [facility] to communicate with providers for non-urgent requests or questions.  Due to long hold times on the telephone, sending your provider a message by [facility] may be a faster and more efficient way to get a response.  Please allow 48 business hours for a response.  Please remember that this is for non-urgent requests.  _______________________________________________________  Your provider has requested that you go to the basement level for lab work before leaving today. Press "B" on the elevator. The lab is located at the first door on the left as you exit the elevator.  Due to recent changes in healthcare laws, you may see the results of your imaging and laboratory studies on MyChart before your provider has had a chance to review them.  We understand that in some cases there may be results that are confusing or concerning to you. Not all laboratory results come back in the same time frame and the provider may be waiting for multiple results in order to interpret others.  Please give [place] 48 hours in order for your provider to thoroughly review all the results before contacting the office for clarification of your results.   You will need a follow up appointment in 1 year.  We will contact you to get this appointment scheduled.  Thank you for entrusting me with your care and choosing [facility].  Dr Ardis Hughs

## 2021-12-11 NOTE — Progress Notes (Signed)
Review of pertinent gastrointestinal problems: 1. Extensive ulcerative colitis: terrible bloody diarrhea, abd pains, nausea/vomting.  diagnosed when admitted with GI bleeding 05/22/16 through 05/24/16, colonoscopy showed normal ileum, severe inflammation from rectum to cecum Pathology revealed chronic active ulcerative colitis throughout the entire colon. Patient was started on Prednisone 20 mg twice a day and Lialda 4 pills daily. He responded very well, tapered easily off the prednisone. Flare 10/2018: Non-bloody diarrhea, urgency.  GI path panel negative, sed rate 41, CRP 34.  Started prednisone 74m daily.  Improved quickly on steroids.   Flare 05/2020 bloody diarrhea, urgency.  GI path panel negative, sed rate elevated, CRP elevated start prednisone 40 mg daily however he continued to decline [facility].  While [facility] he had CT enterography " mild wall thickening in the proximal transverse colon also the mid descending colon.  There is mild edema adjacent to the mid descending colon." Quant TB Gold - December 2021 TPMT enzyme activity December 2021 12 (low normal), hepatitis B surface antigen and antibody - December 2021 Started Remicade at 5 mg/kg and azathioprine 100 mg once daily December 2021 2.  Elevated liver tests likely from underlying fatty liver disease.  AST 40s, ALT 90s.  Blood work December 2019 shows normal iron studies, hepatitis B surface antigen was negative, hepatitis B surface antibody was negative hepatitis a total antibody was negative, hepatitis C antibody was negative, ANA, anti-smooth muscle antibody and alpha-1 antitrypsin were all negative, AMA was negative, ceruloplasmin celiac sprue testing was negative as well.  Abdominal ultrasound December 2019 showed likely fatty liver, no obvious cirrhosis, no bili obstruction. 3.  Morbid obesity; BMI 45 11/2021   HPI: This is a very pleasant 36year old man  I last saw him  here in the office January 2022.  He had just started Remicade and azathioprine and was responding.  He was supposed to follow-up about 6 weeks later however we have not heard from him since then.  He has gained 33 pounds since his last office visit about a year and a half ago.  Same scale.  He is very good about taking [facility] 5 [facility] [place]  He takes azathioprine 100 mg once daily.  On this regimen he feels great.  He has 1 bowel movement every day, very rarely 2 bowel movements a day.  Solid, brown, nonbloody.  He has no abdominal pains.  ROS: complete GI ROS as described in HPI, all other review negative.  Constitutional:  No unintentional weight loss   Past Medical History:  Diagnosis Date   Colitis    Hypertension    Panic anxiety syndrome     Past Surgical History:  Procedure Laterality Date   COLONOSCOPY N/A 05/24/2016   Procedure: COLONOSCOPY;  Surgeon: DMilus Banister MD;  [facility];  Service: Endoscopy;  Laterality: N/A;   ESOPHAGOGASTRODUODENOSCOPY N/A 05/24/2016   Procedure: ESOPHAGOGASTRODUODENOSCOPY (EGD);  Surgeon: DMilus Banister MD;  [facility];  Service: Endoscopy;  Laterality: N/A;   LUMBAR LAMINECTOMY/DECOMPRESSION MICRODISCECTOMY N/A 02/14/2016   Procedure: MICRO  LUMBER L4-5;  Surgeon: JSusa Day MD;  [facility];  Service: Orthopedics;  Laterality: N/A;    Current Outpatient Medications  Medication Instructions   azathioprine (IMURAN) 100 MG tablet TAKE 1 TABLET BY MOUTH EVERY DAY   citalopram (CELEXA) 20 mg, Oral, Daily   inFLIXimab-dyyb (INFLECTRA) 100 MG SOLR Intravenous   lisinopril (ZESTRIL) 20 MG  tablet TAKE 1 TABLET BY MOUTH EVERY DAY   LORazepam (ATIVAN) 0.5 mg, Oral, 2 times daily PRN    Allergies as of 12/11/2021 - Review Complete 12/11/2021  Allergen Reaction Noted   Penicillins Hives 02/06/2016   Sulfa antibiotics Other (See Comments) 02/06/2016     Family History  Problem Relation Age of Onset   Hypertension Mother    Diabetes Mother    Hyperlipidemia Mother    Hypertension Father    Diabetes Father    High Cholesterol Maternal Grandmother    Hypertension Maternal Grandmother    Crohn's disease Maternal Grandmother    Heart disease Maternal Grandfather    High Cholesterol Maternal Grandfather    Hypertension Maternal Grandfather    Stroke Maternal Grandfather    Cancer Paternal Grandmother        unknown   Diabetes Paternal Grandmother    Heart disease Paternal Grandmother    High Cholesterol Paternal Grandmother    Hypertension Paternal Grandmother    Heart disease Paternal Grandfather    High Cholesterol Paternal Grandfather    Hypertension Paternal Grandfather    Stroke Paternal Grandfather    Colon cancer Neg Hx    Stomach cancer Neg Hx    Esophageal cancer Neg Hx    Pancreatic cancer Neg Hx    Liver disease Neg Hx     Social History   Socioeconomic History   Marital status: Married    Spouse name: Not on file   Number of children: 0   Years of education: Not on file   Highest education level: Not on file  Occupational History   Not on file  Tobacco Use   Smoking status: Never   Smokeless tobacco: Never  Vaping Use   Vaping Use: Never used  Substance and Sexual Activity   Alcohol use: Yes    Comment: occ   Drug use: No   Sexual activity: Yes    Partners: Female  Other Topics Concern   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, no children   Fun: golf   Social Determinants of Health   Financial Resource Strain: Not on file  Food Insecurity: Not on file  Transportation Needs: Not on file  Physical Activity: Not on file  Stress: Not on file  Social Connections: Not on file  Intimate Partner Violence: Not on file     Physical Exam: BP 130/76   Pulse 98   Ht 6' 3"  (1.905 m)   Wt (!) 362 lb 6 oz (164.4 kg)   BMI 45.29 kg/m   Constitutional: generally well-appearing, morbid obesity Psychiatric: alert and oriented x3 Abdomen: soft, nontender, nondistended, no obvious ascites, no peritoneal signs, normal bowel sounds No peripheral edema noted in lower extremities  Assessment and plan: 36 y.o. male with extensive ulcerative colitis  His symptoms are under very good control since starting dual therapy biologic, immunomodulators.  Clinically he has complete control on this regimen.  We need to get TB testing, basic set of labs today including a CBC and complete metabolic profile.  He will continue his azathioprine 100 mg once daily, admittedly this is quite a low dose for him and his size but seems like he is doing great and perhaps it is helping to keep him from developing antibodies to his Remicade.  He will continue his Remicade 5 mg/kg every 8 weeks.  He will return to see me in 1 year and sooner if any  issues.  Please see the "Patient Instructions" section for addition details about the plan.  [name], MD [facility] 12/11/2021, 9:11 AM   Total time on date of encounter was 25 minutes (this included time spent preparing to see the patient reviewing records; obtaining and/or reviewing separately obtained history; performing a medically appropriate exam and/or evaluation; counseling and educating the patient and family if present; ordering medications, tests or procedures if applicable; and documenting clinical information in the health record).

## 2021-12-14 LAB — QUANTIFERON-TB GOLD PLUS
Mitogen-NIL: >10 IU/mL
NIL: 0.03 IU/mL
QuantiFERON-TB Gold Plus: NEGATIVE
TB1-NIL: 0.02 IU/mL
TB2-NIL: 0.02 IU/mL

## 2021-12-23 ENCOUNTER — Other Ambulatory Visit: Payer: Self-pay | Admitting: Gastroenterology

## 2021-12-23 DIAGNOSIS — Z79899 Other long term (current) drug therapy: Secondary | ICD-10-CM

## 2021-12-23 DIAGNOSIS — K5100 Ulcerative (chronic) pancolitis without complications: Secondary | ICD-10-CM

## 2022-03-27 ENCOUNTER — Encounter: Payer: Self-pay | Admitting: Gastroenterology

## 2022-03-27 NOTE — Telephone Encounter (Signed)
Dr Havery Moros can you please review as DOD?

## 2022-06-24 ENCOUNTER — Other Ambulatory Visit: Payer: Self-pay | Admitting: Gastroenterology

## 2022-06-24 DIAGNOSIS — K5100 Ulcerative (chronic) pancolitis without complications: Secondary | ICD-10-CM

## 2022-06-24 DIAGNOSIS — Z79899 Other long term (current) drug therapy: Secondary | ICD-10-CM

## 2022-06-24 NOTE — Telephone Encounter (Signed)
Can refill but he is due for CBC and CMET if you can order that.  I would like to see him in the office if you can help coordinate a visit with me, and give him enough Azathioprine to get him to that visit. Thanks

## 2022-06-24 NOTE — Telephone Encounter (Signed)
This is a patient of Dr Ardis Hughs.  It looks like you had answered a question in September.  Please advise on refills.  Thank you

## 2022-06-28 ENCOUNTER — Telehealth: Payer: Self-pay

## 2022-06-28 NOTE — Telephone Encounter (Signed)
Left message for patient to return call to further discuss the need for lab work and to schedule a follow up appointment with Dr Havery Moros.  Will continue efforts.

## 2022-06-28 NOTE — Telephone Encounter (Signed)
-----   Message from Stevan Born, [place] sent at 06/26/2022  1:24 PM EST ----- Can refill but he is due for CBC and CMET if you can order that.  I would like to see him in the office if you can help coordinate a visit with me, and give him enough Azathioprine to get him to that visit. Thanks

## 2022-07-01 NOTE — Telephone Encounter (Signed)
Scheduled follow up visit with Dr. Havery Moros on 2/7//24 at 8:30 am.

## 2022-08-21 ENCOUNTER — Ambulatory Visit (INDEPENDENT_AMBULATORY_CARE_PROVIDER_SITE_OTHER): Payer: Commercial Managed Care - PPO | Admitting: Gastroenterology

## 2022-08-21 ENCOUNTER — Encounter: Payer: Self-pay | Admitting: Gastroenterology

## 2022-08-21 VITALS — BP 132/80 | HR 81 | Ht 75.0 in | Wt 347.0 lb

## 2022-08-21 DIAGNOSIS — K760 Fatty (change of) liver, not elsewhere classified: Secondary | ICD-10-CM | POA: Diagnosis not present

## 2022-08-21 DIAGNOSIS — Z79899 Other long term (current) drug therapy: Secondary | ICD-10-CM

## 2022-08-21 DIAGNOSIS — K5100 Ulcerative (chronic) pancolitis without complications: Secondary | ICD-10-CM

## 2022-08-21 MED ORDER — SUTAB 1479-225-188 MG PO TABS: 24.0000 | ORAL_TABLET | Freq: Once | ORAL | 0 refills | Status: AC

## 2022-08-21 MED ORDER — SHINGRIX 50 MCG/0.5ML IM SUSR: 0.5000 mL | Freq: Once | INTRAMUSCULAR | 0 refills | Status: AC

## 2022-08-21 NOTE — Progress Notes (Signed)
HPI :  37 y/o [name] here for a follow up visit for UC and fatty liver. Last seen in May 2023. He follows with Dr. Ardis [name], this is my first time seeing him.  See below for Dr. Ardis [name] review of his case to date.  He was diagnosed with pan ulcerative colitis in 2017, initially managed with Lialda but had severe flare leading to hospitalization in 2021.  At that time was transition to Remicade and azathioprine.  Intermediate metabolizer for TPMT, he has been on 100 mg of azathioprine and Remicade 5 mg/kg every 8 weeks.  He has tolerated the regimen well, it has worked quite well to control his colitis.  He denies any problems with his bowels, no flares or hospitalizations since being on the regimen.  No abdominal pains, no loose stools, no blood in his stools.  He has not had any complications from the regimen.  He has not had a follow-up colonoscopy since his initial diagnosis which was about 7 years ago.  He has never had Remicade levels checked.  We discussed his options as outlined below.  He is up-to-date with his flu vaccine and PCV 23.  He has not had the newer Pneumovax, never had shingles vaccine.  His body mass index is 43, he has had fatty liver for several years with mild ALT elevation.  More recently has been on Ozempic since November, this is really helped with weight loss, he is down about 17 pounds.  He drinks alcohol only socially/occasionally, nothing routine.  No known history of cirrhosis. He has never had a prior elastography.  He is otherwise feeling well without any complaints today.   Review of pertinent gastrointestinal problems: 1. Extensive ulcerative colitis: terrible bloody diarrhea, abd pains, nausea/vomting.  diagnosed when admitted with GI bleeding 05/22/16 through 05/24/16, colonoscopy showed normal ileum, severe inflammation from rectum to cecum Pathology revealed chronic active ulcerative colitis throughout the entire colon. Patient was started on Prednisone 20 mg twice a  day and Lialda 4 pills daily. He responded very well, tapered easily off the prednisone. Flare 10/2018: Non-bloody diarrhea, urgency.  GI path panel negative, sed rate 41, CRP 34.  Started prednisone 20mg  daily.  Improved quickly on steroids.   Flare 05/2020 bloody diarrhea, urgency.  GI path panel negative, sed rate elevated, CRP elevated start prednisone 40 mg daily however he continued to decline and eventually was admitted to [facility] for IV steroids.  While there he had CT enterography " mild wall thickening in the proximal transverse colon also the mid descending colon.  There is mild edema adjacent to the mid descending colon." Quant TB Gold - December 2021 TPMT enzyme activity December 2021 12 (low normal), hepatitis B surface antigen and antibody - December 2021 Started Remicade at 5 mg/kg and azathioprine 100 mg once daily December 2021 2.  Elevated liver tests likely from underlying fatty liver disease.  AST 40s, ALT 90s.  Blood work December 2019 shows normal iron studies, hepatitis B surface antigen was negative, hepatitis B surface antibody was negative hepatitis a total antibody was negative, hepatitis C antibody was negative, ANA, anti-smooth muscle antibody and alpha-1 antitrypsin were all negative, AMA was negative, ceruloplasmin celiac sprue testing was negative as well.  Abdominal ultrasound December 2019 showed likely fatty liver, no obvious cirrhosis, no bili obstruction. 3.  Morbid obesity; BMI 45 11/2021    Past Medical History:  Diagnosis Date   Colitis    Fatty liver    Hypertension  Morbid obesity ([place])    Panic anxiety syndrome      Past Surgical History:  Procedure Laterality Date   COLONOSCOPY N/A 05/24/2016   Procedure: COLONOSCOPY;  Surgeon: [name], MD;  Location: [facility];  Service: Endoscopy;  Laterality: N/A;   ESOPHAGOGASTRODUODENOSCOPY N/A 05/24/2016   Procedure: ESOPHAGOGASTRODUODENOSCOPY (EGD);  Surgeon: [name], MD;   Location: [facility];  Service: Endoscopy;  Laterality: N/A;   LUMBAR LAMINECTOMY/DECOMPRESSION MICRODISCECTOMY N/A 02/14/2016   Procedure: MICRO  LUMBER L4-5;  Surgeon: Susa Day, MD;  Location: [facility];  Service: Orthopedics;  Laterality: N/A;   Family History  Problem Relation Age of Onset   Hypertension Mother    Diabetes Mother    Hyperlipidemia Mother    Hypertension Father    Diabetes Father    High Cholesterol Maternal Grandmother    Hypertension Maternal Grandmother    Crohn's disease Maternal Grandmother    Heart disease Maternal Grandfather    High Cholesterol Maternal Grandfather    Hypertension Maternal Grandfather    Stroke Maternal Grandfather    Cancer Paternal Grandmother        unknown   Diabetes Paternal Grandmother    Heart disease Paternal Grandmother    High Cholesterol Paternal Grandmother    Hypertension Paternal Grandmother    Heart disease Paternal Grandfather    High Cholesterol Paternal Grandfather    Hypertension Paternal Grandfather    Stroke Paternal Grandfather    Colon cancer Neg Hx    Stomach cancer Neg Hx    Esophageal cancer Neg Hx    Pancreatic cancer Neg Hx    Liver disease Neg Hx    Social History   Tobacco Use   Smoking status: Never   Smokeless tobacco: Never  Vaping Use   Vaping Use: Never used  Substance Use Topics   Alcohol use: Yes    Comment: occ   Drug use: No   Current Outpatient Medications  Medication Sig Dispense Refill   azathioprine (IMURAN) 100 MG tablet TAKE 1 TABLET BY MOUTH EVERY DAY 30 tablet 2   citalopram (CELEXA) 20 MG tablet Take 20 mg by mouth daily.     inFLIXimab-dyyb (INFLECTRA) 100 MG SOLR Inject into the vein.     lisinopril (ZESTRIL) 20 MG tablet TAKE 1 TABLET BY MOUTH EVERY DAY 30 tablet 0   OZEMPIC, 0.25 OR 0.5 MG/DOSE, 2 MG/3ML SOPN Inject 0.5 mg into the skin once a week.     LORazepam (ATIVAN) 0.5 MG tablet Take 1 tablet (0.5 mg total) by mouth 2 (two) times daily as needed for anxiety.  (Patient not taking: Reported on 12/11/2021) 30 tablet 0   No current facility-administered medications for this visit.   Allergies  Allergen Reactions   Penicillins Hives    Has patient had a PCN reaction causing immediate rash, facial/tongue/throat swelling, SOB or lightheadedness with hypotension: no Has patient had a PCN reaction causing severe rash involving mucus membranes or skin necrosis: no Has patient had a PCN reaction that required hospitalization no Has patient had a PCN reaction occurring within the last 10 years: yes If all of the above answers are "NO", then may proceed with Cephalosporin use.   Sulfa Antibiotics Other (See Comments)    unsure     Review of Systems: All systems reviewed and negative except where noted in HPI.   Lab Results  Component Value Date   WBC 7.5 12/11/2021   HGB 15.6 12/11/2021   HCT 44.5 12/11/2021  MCV 87.5 12/11/2021   PLT 285.0 12/11/2021    Lab Results  Component Value Date   CREATININE 0.94 12/11/2021   BUN 15 12/11/2021   NA 136 12/11/2021   K 4.0 12/11/2021   CL 100 12/11/2021   CO2 26 12/11/2021    Lab Results  Component Value Date   ALT 53 12/11/2021   AST 37 12/11/2021   ALKPHOS Jeffrey 12/11/2021   BILITOT 0.6 12/11/2021     Physical Exam: BP 132/80   Pulse 81   Ht 6\' 3"  (1.905 m)   Wt (!) 347 lb (157.4 kg)   BMI 43.37 kg/m  Constitutional: Pleasant,well-developed, [name] in no acute distress. Neurological: Alert and oriented to person place and time.Marland Kitchen Psychiatric: Normal mood and affect. Behavior is normal.   ASSESSMENT: 37 y.o. [name] here for assessment of the following  1. Ulcerative pancolitis without complication ([place])   2. High risk medication use   3. Fatty liver    As above, clinically excellent control of colitis on Remicade and azathioprine regimen for close to 3 years now.  We discussed his course to date and options moving forward.  Discussed risks of the regimen he is on to include  immunosuppression, risk for infection, and risk for lymphoma particularly with azathioprine use in a young [name].  Ideally would like to peel-off azathioprine if he can tolerate that, given risk of lymphoma, he has been on this for a few years now and hopefully this will help prevent immunogenicity to Remicade moving forward.  He is due for yearly labs, I would like to add Remicade level with trough level to proactively monitor this.  He is due for his next infusion of Remicade on Monday, he will have these labs drawn Monday morning.  Assuming his Remicade level looks good without antibody resistance, I think may be reasonable to stop azathioprine and continue Remicade as monotherapy.  We discussed this for a bit and he is comfortable with doing that.  I recommended Shingrix vaccine given his immunosuppression, prescription written for that.  He will talk to his primary care about the new PCV 20 Pneumovax.  Otherwise, given duration of his colitis at this point, recommend surveillance colonoscopy on this regimen to assess for mucosal healing, pseudopolyposis etc.  He is agreeable to this following discussion of risks and benefits, will be scheduled at the [facility].  We discussed his fatty liver, he has had chronic ALT elevation for some time.  Discussed risks for fibrosis and cirrhosis moving forward.  I offered him an elastography to assess for fibrotic change given duration of fatty liver he has had, he wants to proceed with this.  Otherwise he is on Ozempic which is been really helping his weight loss and hopefully that we will continue to provide him some benefit.  Will repeat his LFTs and he should have those checked at least yearly.  He will continue to minimize his alcohol use.  All questions answered he agrees with the plan as outlined.   PLAN: - lab - CBC, CMET, vitamin D level, Remicade trough level and antibodies to Remicade, quantiferon gold - continue Remicade as currently dosed - schedule  colonoscopy at the [facility] - recommend Shingrix vaccine - he will talk with PCP about PCV 20 vaccine - hoping to stop Azathioprine if Remicade levels look okay. See discussion of risks / benefits above - schedule for RUQ [place] with elastography - continue Ozempic, work on weight loss - minimize EtOH  Jolly Mango, MD [facility]

## 2022-08-21 NOTE — Patient Instructions (Addendum)
_______________________________________________________  If your blood pressure at your visit was 140/90 or greater, please contact your primary care physician to follow up on this.  _______________________________________________________  If you are age 38 or older, your body mass index should be between 23-30. Your Body mass index is 43.37 kg/m. If this is out of the aforementioned range listed, please consider follow up with your Primary Care Provider.  If you are age 37 or younger, your body mass index should be between 19-25. Your Body mass index is 43.37 kg/m. If this is out of the aformentioned range listed, please consider follow up with your Primary Care Provider.   ________________________________________________________  The [facility] providers would like to encourage you to use [facility] to communicate with providers for non-urgent requests or questions.  Due to long hold times on the telephone, sending your provider a message by [facility] may be a faster and more efficient way to get a response.  Please allow 48 business hours for a response.  Please remember that this is for non-urgent requests.  _______________________________________________________  Dennis Bast have been scheduled for a colonoscopy. Please follow written instructions given to you at your visit today.  Please pick up your prep supplies at the pharmacy within the next 1-3 days. If you use inhalers (even only as needed), please bring them with you on the day of your procedure.   Please go to the lab in the basement of our building to have lab work done on Monday morning, 2-12 just before your next Remicade infusion. Hit "B" for basement when you get on the elevator.  When the doors open the lab is on your left.  We will call you with the results. Thank you.   We are giving you a Shingrix vaccine order to take to your pharmacy.   You will be contacted by [place] in the next 2 days to arrange a  Abdominal Ultrasound with Elastography.  The number on your caller ID will be [phone], please answer when they call.  If you have not heard from them in 2 days please call [phone] to schedule.     Thank you for entrusting me with your care and for choosing [facility], Dr. Westway Cellar

## 2022-08-26 ENCOUNTER — Other Ambulatory Visit (INDEPENDENT_AMBULATORY_CARE_PROVIDER_SITE_OTHER): Payer: Commercial Managed Care - PPO

## 2022-08-26 DIAGNOSIS — Z79899 Other long term (current) drug therapy: Secondary | ICD-10-CM | POA: Diagnosis not present

## 2022-08-26 DIAGNOSIS — K5100 Ulcerative (chronic) pancolitis without complications: Secondary | ICD-10-CM | POA: Diagnosis not present

## 2022-08-26 DIAGNOSIS — K760 Fatty (change of) liver, not elsewhere classified: Secondary | ICD-10-CM

## 2022-08-26 LAB — CBC WITH DIFFERENTIAL/PLATELET
Basophils Absolute: 0 10*3/uL (ref 0.0–0.1)
Basophils Relative: 0.5 % (ref 0.0–3.0)
Eosinophils Absolute: 0.1 10*3/uL (ref 0.0–0.7)
Eosinophils Relative: 1.3 % (ref 0.0–5.0)
HCT: 47 % (ref 39.0–52.0)
Hemoglobin: 16 g/dL (ref 13.0–17.0)
Lymphocytes Relative: 41.4 % (ref 12.0–46.0)
Lymphs Abs: 3.6 10*3/uL (ref 0.7–4.0)
MCHC: 34 g/dL (ref 30.0–36.0)
MCV: 88.5 fl (ref 78.0–100.0)
Monocytes Absolute: 0.7 10*3/uL (ref 0.1–1.0)
Monocytes Relative: 7.5 % (ref 3.0–12.0)
Neutro Abs: 4.3 10*3/uL (ref 1.4–7.7)
Neutrophils Relative %: 49.3 % (ref 43.0–77.0)
Platelets: 340 10*3/uL (ref 150.0–400.0)
RBC: 5.31 Mil/uL (ref 4.22–5.81)
RDW: 13.2 % (ref 11.5–15.5)
WBC: 8.8 10*3/uL (ref 4.0–10.5)

## 2022-08-26 LAB — COMPREHENSIVE METABOLIC PANEL
ALT: 38 U/L (ref 0–53)
AST: 26 U/L (ref 0–37)
Albumin: 4.4 g/dL (ref 3.5–5.2)
Alkaline Phosphatase: 51 U/L (ref 39–117)
BUN: 13 mg/dL (ref 6–23)
CO2: 23 mEq/L (ref 19–32)
Calcium: 9.7 mg/dL (ref 8.4–10.5)
Chloride: 104 mEq/L (ref 96–112)
Creatinine, Ser: 0.91 mg/dL (ref 0.40–1.50)
GFR: 107.61 mL/min (ref >60.00)
Glucose, Bld: 116 mg/dL — ABNORMAL HIGH (ref 70–99)
Potassium: 4.1 mEq/L (ref 3.5–5.1)
Sodium: 138 mEq/L (ref 135–145)
Total Bilirubin: 0.3 mg/dL (ref 0.2–1.2)
Total Protein: 7.1 g/dL (ref 6.0–8.3)

## 2022-08-28 ENCOUNTER — Telehealth: Payer: Self-pay

## 2022-08-28 LAB — QUANTIFERON-TB GOLD PLUS
Mitogen-NIL: 8.07 IU/mL
NIL: 0.05 IU/mL
QuantiFERON-TB Gold Plus: NEGATIVE
TB1-NIL: 0.01 IU/mL
TB2-NIL: 0.01 IU/mL

## 2022-08-28 NOTE — Telephone Encounter (Signed)
Called patient and LM to call [facility] scheduling at [phone] to get scheduled for RUQ U/S with Elastography for Dr. Havery Moros

## 2022-08-28 NOTE — Telephone Encounter (Signed)
-----   Message from April H Pait sent at 08/28/2022 12:45 PM EST ----- Regarding: RE: RUQ U/S with Elastography 2 vm have been left for him to call us to sched, he has not as of yet.  ----- Message ----- From: Roetta Sessions, CMA Sent: 08/28/2022  12:35 PM EST To: April H Pait; Roosvelt Maser Subject: FW: RUQ U/S with Elastography                  Could you provide an update on getting this patient scheduled for RUQ U/S please?  Thanks, jan   ----- Message ----- From: Roetta Sessions, CMA Sent: 08/21/2022   9:17 AM EST To: April H Pait; Roosvelt Maser Subject: RUQ U/S with Elastography                      Please call patient to schedule RUQ U/S with Elastography at [facility] for Dr. Havery Moros  Thank you, Mary Sella

## 2022-09-02 LAB — INFLIXIMAB+AB (SERIAL MONITOR)
Anti-Infliximab Antibody: <22 ng/mL
Infliximab Drug Level: 13 ug/mL

## 2022-09-02 LAB — SERIAL MONITORING

## 2022-09-22 ENCOUNTER — Other Ambulatory Visit: Payer: Self-pay | Admitting: Gastroenterology

## 2022-09-22 DIAGNOSIS — K5100 Ulcerative (chronic) pancolitis without complications: Secondary | ICD-10-CM

## 2022-09-22 DIAGNOSIS — Z79899 Other long term (current) drug therapy: Secondary | ICD-10-CM

## 2022-10-22 ENCOUNTER — Encounter: Payer: Commercial Managed Care - PPO | Admitting: Gastroenterology

## 2023-01-14 ENCOUNTER — Encounter: Payer: Self-pay | Admitting: Gastroenterology

## 2023-01-14 ENCOUNTER — Ambulatory Visit (AMBULATORY_SURGERY_CENTER): Payer: Commercial Managed Care - PPO

## 2023-01-14 VITALS — Ht 75.0 in | Wt 330.0 lb

## 2023-01-14 DIAGNOSIS — K5100 Ulcerative (chronic) pancolitis without complications: Secondary | ICD-10-CM

## 2023-01-14 NOTE — Progress Notes (Signed)
Pre visit completed via phone call; Patient verified name, DOB, and address;  No egg or soy allergy known to patient;  No issues known to pt with past sedation with any surgeries or procedures; Patient denies ever being told they had issues or difficulty with intubation;  No FH of Malignant Hyperthermia; Pt is not on diet pills; Pt is not on home 02;  Pt is not on blood thinners;  Pt denies issues with constipation;  No A fib or A flutter; Have any cardiac testing pending--NO Pt instructed to use Singlecare.com or GoodRx for a price reduction on prep;   Insurance verified during PV appt=[employer]  Patient's chart reviewed by Cathlyn Parsons CNRA prior to previsit and patient appropriate for the [facility].  Previsit completed and red dot placed by patient's name on their procedure day (on provider's schedule).    Instructions sent to patient via MyChart per his request; patient also reports he has the Sutab Rx from his last cancelled appt; Sutab Rx not sent in at this time;

## 2023-02-11 ENCOUNTER — Encounter: Payer: Self-pay | Admitting: Gastroenterology

## 2023-02-11 ENCOUNTER — Ambulatory Visit (AMBULATORY_SURGERY_CENTER): Payer: Commercial Managed Care - PPO | Admitting: Gastroenterology

## 2023-02-11 VITALS — BP 143/90 | HR 76 | Temp 98.4°F | Resp 13 | Ht 75.0 in | Wt 330.0 lb

## 2023-02-11 DIAGNOSIS — K5150 Left sided colitis without complications: Secondary | ICD-10-CM

## 2023-02-11 DIAGNOSIS — K5100 Ulcerative (chronic) pancolitis without complications: Secondary | ICD-10-CM

## 2023-02-11 DIAGNOSIS — K5140 Inflammatory polyps of colon without complications: Secondary | ICD-10-CM

## 2023-02-11 MED ORDER — SODIUM CHLORIDE 0.9 % IV SOLN: 500.0000 mL | Freq: Once | INTRAVENOUS | Status: DC

## 2023-02-11 NOTE — Progress Notes (Signed)
Vss nad trans to pacu 

## 2023-02-11 NOTE — Progress Notes (Signed)
[facility] History and Physical   Primary Care Physician:  Alberteen Sam, FNP   Reason for Procedure:   Ulcerative colitis  Plan:    colonoscopy     HPI: [name] is a 37 y.o. male  here for colonoscopy surveillance - history of UC dx 2017. On Remicade / azathioprine over the years and doing well. Remicade therapeutic. Recently stopped Azathioprine. Continues to feel well. Colonoscopy to reassess disease activity.   Patient denies any bowel symptoms at this time.  Otherwise feels well without any cardiopulmonary symptoms.   I have discussed risks / benefits of anesthesia and endoscopic procedure with Ritta Slot and they wish to proceed with the exams as outlined today.    Past Medical History:  Diagnosis Date   Colitis    Diabetes mellitus without complication (HCC)    on meds   Fatty liver    Hypertension    on meds   Morbid obesity (HCC)    Panic anxiety syndrome    on meds    Past Surgical History:  Procedure Laterality Date   COLONOSCOPY N/A 05/24/2016   Procedure: COLONOSCOPY;  Surgeon: Rachael Fee, MD;  [facility];  Service: Endoscopy;  Laterality: N/A;   ESOPHAGOGASTRODUODENOSCOPY N/A 05/24/2016   Procedure: ESOPHAGOGASTRODUODENOSCOPY (EGD);  Surgeon: Rachael Fee, MD;  Location: [facility];  Service: Endoscopy;  Laterality: N/A;   LUMBAR LAMINECTOMY/DECOMPRESSION MICRODISCECTOMY N/A 02/14/2016   Procedure: MICRO  LUMBER L4-5;  Surgeon: Jene Every, MD;  Location: [facility];  Service: Orthopedics;  Laterality: N/A;    Prior to Admission medications   Medication Sig Start Date End Date Taking? Authorizing Provider  citalopram (CELEXA) 20 MG tablet Take 20 mg by mouth daily. 11/08/21  Yes [provider]  lisinopril (ZESTRIL) 20 MG tablet TAKE 1 TABLET BY MOUTH EVERY DAY 06/22/20  Yes Worley, Botkins, PA  inFLIXimab-dyyb (INFLECTRA) 100 MG SOLR Inject into the vein every 8 (eight) weeks.     [provider]  OZEMPIC, 2 MG/DOSE, 8 MG/3ML SOPN Inject 1 Dose into the skin once a week.    [provider]    Current Outpatient Medications  Medication Sig Dispense Refill   citalopram (CELEXA) 20 MG tablet Take 20 mg by mouth daily.     lisinopril (ZESTRIL) 20 MG tablet TAKE 1 TABLET BY MOUTH EVERY DAY 30 tablet 0   inFLIXimab-dyyb (INFLECTRA) 100 MG SOLR Inject into the vein every 8 (eight) weeks.     OZEMPIC, 2 MG/DOSE, 8 MG/3ML SOPN Inject 1 Dose into the skin once a week.     Current Facility-Administered Medications  Medication Dose Route Frequency Provider Last Rate Last Admin   0.9 %  sodium chloride infusion  500 mL Intravenous Once Jabree Pernice, Willaim Rayas, MD        Allergies as of 02/11/2023 - Review Complete 02/11/2023  Allergen Reaction Noted   Sulfa antibiotics Other (See Comments) 02/06/2016   Penicillin g Hives, Itching, and Rash 01/14/2023   Penicillins Hives, Itching, and Rash 02/06/2016    Family History  Problem Relation Age of Onset   Hypertension Mother    Diabetes Mother    Hyperlipidemia Mother    Hypertension Father    Diabetes Father    High Cholesterol Maternal Grandmother    Hypertension Maternal Grandmother    Crohn's disease Maternal Grandmother    Heart disease Maternal Grandfather    High Cholesterol Maternal Grandfather    Hypertension Maternal Grandfather    Stroke  Maternal Grandfather    Cancer Paternal Grandmother        unknown   Diabetes Paternal Grandmother    Heart disease Paternal Grandmother    High Cholesterol Paternal Grandmother    Hypertension Paternal Grandmother    Heart disease Paternal Grandfather    High Cholesterol Paternal Grandfather    Hypertension Paternal Grandfather    Stroke Paternal Grandfather    Colon cancer Neg Hx    Stomach cancer Neg Hx    Esophageal cancer Neg Hx    Pancreatic cancer Neg Hx    Liver disease Neg Hx     Social History   Socioeconomic History   Marital status:  Married    Spouse name: Not on file   Number of children: 0   Years of education: Not on file   Highest education level: Not on file  Occupational History   Not on file  Tobacco Use   Smoking status: Never   Smokeless tobacco: Never  Vaping Use   Vaping status: Never Used  Substance and Sexual Activity   Alcohol use: Yes    Alcohol/week: 2.0 standard drinks of alcohol    Types: 2 Standard drinks or equivalent per week    Comment: occ   Drug use: No   Sexual activity: Yes    Partners: Female  Other Topics Concern   Not on file  Social History Narrative   Oceanographer for [employer]   Married, no children   Fun: golf   Social Determinants of Health   Financial Resource Strain: Low Risk  (09/30/2022)   Received from [facility], [facility]   Overall Financial Resource Strain (CARDIA)    Difficulty of Paying Living Expenses: Not hard at all  Food Insecurity: No Food Insecurity (09/30/2022)   Received from [facility], [facility]   Hunger Vital Sign    Worried About Running Out of Food in the Last Year: Never true    Ran Out of Food in the Last Year: Never true  Transportation Needs: No Transportation Needs (09/30/2022)   Received from [facility], [facility]   PRAPARE - Transportation    Lack of Transportation (Medical): No    Lack of Transportation (Non-Medical): No  Physical Activity: Insufficiently Active (09/30/2022)   Received from [facility], [facility]   Exercise Vital Sign    Days of Exercise per Week: 3 days    Minutes of Exercise per Session: 20 min  Stress: No Stress Concern Present (06/09/2020)   Received from [facility], [facility] of Occupational Health - Occupational Stress Questionnaire    Feeling of Stress : Not at all  Social Connections: Moderately Integrated (09/30/2022)   Received from [facility], [facility]   Social Network    How would you rate your social network (family, work,  friends)?: Adequate participation with social networks  Intimate Partner Violence: Not At Risk (09/30/2022)   Received from [facility], [facility]   HITS    Over the last 12 months how often did your partner physically hurt you?: 1    Over the last 12 months how often did your partner insult you or talk down to you?: 1    Over the last 12 months how often did your partner threaten you with physical harm?: 1    Over the last 12 months how often did your partner scream or curse at you?: 1    Review of Systems: All other review of  systems negative except as mentioned in the HPI.  Physical Exam: Vital signs BP (!) 151/93   Pulse 93   Temp 98.4 F (36.9 C)   Resp 20   Ht 6\' 3"  (1.905 m)   Wt (!) 330 lb (149.7 kg)   SpO2 100%   BMI 41.25 kg/m   General:   Alert,  Well-developed, pleasant and cooperative in NAD Lungs:  Clear throughout to auscultation.   Heart:  Regular rate and rhythm Abdomen:  Soft, nontender and nondistended.   Neuro/Psych:  Alert and cooperative. Normal mood and affect. A and O x 3  Harlin Rain, MD [facility]

## 2023-02-11 NOTE — Progress Notes (Signed)
Called to room to assist during endoscopic procedure.  Patient ID and intended procedure confirmed with present staff. Received instructions for my participation in the procedure from the performing physician.  

## 2023-02-11 NOTE — Op Note (Signed)
[facility] Patient Name: [name] Procedure Date: 02/11/2023 7:55 AM MRN: [id number] Endoscopist: Viviann Spare P. 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 Feeling well, in clinical                            remission. Medicines:                Monitored Anesthesia Care Procedure:                Pre-Anesthesia Assessment:                           - Prior to the procedure, a History and Physical                            was performed, and patient medications and                            allergies were reviewed. The patient's tolerance of                            previous anesthesia was also reviewed. The risks                            and benefits of the procedure and the sedation                            options and risks were discussed with the patient.                            All questions were answered, and informed consent                            was obtained. Prior Anticoagulants: The patient has                            taken no anticoagulant or antiplatelet agents. ASA  Grade Assessment: III - A patient with severe                            systemic disease. After reviewing the risks and                            benefits, the patient was deemed in satisfactory                            condition to undergo the procedure.                           After obtaining informed consent, the colonoscope                            was passed under direct vision.  Throughout the                            procedure, the patient's blood pressure, pulse, and                            oxygen saturations were monitored continuously. The                            Olympus Scope SN: [id number] was introduced through                            the anus and advanced to the the terminal ileum,                            with identification of the appendiceal orifice and                            IC valve. The colonoscopy was performed without                            difficulty. The patient tolerated the procedure                            well. The quality of the bowel preparation was                            adequate. The terminal ileum, ileocecal valve,                            appendiceal orifice, and rectum were photographed. Scope In: 8:01:01 AM Scope Out: 8:13:52 AM Scope Withdrawal Time: 0 hours 10 minutes 24 seconds  Total Procedure Duration: 0 hours 12 minutes 51 seconds  Findings:                 The perianal and digital rectal examinations were                            normal.  The terminal ileum appeared normal.                           Patchy pseudopolyps were found in the sigmoid                            colon, in the descending colon, in the transverse                            colon, at the hepatic flexure and in the ascending                            colon. Highest burden in the transverse colon. No                            obvious adenomatous polyps.                           Internal hemorrhoids were found during                            retroflexion. The hemorrhoids were small.                           The exam was otherwise without abnormality. NO                            active inflammation appreciated.                           Biopsies were taken with a cold forceps for                            histology from all parts of the colon including                            sampling of  pseudopolyps. Complications:            No immediate complications. Estimated blood loss:                            Minimal. Estimated Blood Loss:     Estimated blood loss was minimal. Impression:               - The examined portion of the ileum was normal.                           - Pseudopolyps in the sigmoid colon, in the                            descending colon, in the transverse colon, at the                            hepatic flexure and in the ascending colon.                           -  Internal hemorrhoids.                           - The examination was otherwise normal. NO active                            inflammation.                           - Biopsies were taken with a cold forceps for                            histology for surveillance. Recommendation:           - Patient has a contact number available for                            emergencies. The signs and symptoms of potential                            delayed complications were discussed with the                            patient. Return to normal activities tomorrow.                            Written discharge instructions were provided to the                            patient.                           - Resume previous diet.                           - Continue present medications.                           - Await pathology results. Viviann Spare P. Adela Lank, MD 02/11/2023 8:20:40 AM This report has been signed electronically.

## 2023-02-11 NOTE — Progress Notes (Signed)
VS by EP  Pt's states no medical or surgical changes since previsit or office visit.

## 2023-02-11 NOTE — Patient Instructions (Signed)

## 2023-02-12 ENCOUNTER — Telehealth: Payer: Self-pay | Admitting: *Deleted

## 2023-02-12 NOTE — Telephone Encounter (Signed)
  Follow up Call-     02/11/2023    7:22 AM  Call back number  Post procedure Call Back phone  # [phone]  Permission to leave phone message Yes     Patient questions:  Do you have a fever, pain , or abdominal swelling? No. Pain Score  0 *  Have you tolerated food without any problems? Yes.    Have you been able to return to your normal activities? Yes.    Do you have any questions about your discharge instructions: Diet   No. Medications  No. Follow up visit  No.  Do you have questions or concerns about your Care? No.  Actions: * If pain score is 4 or above: No action needed, pain <4.

## 2023-05-23 ENCOUNTER — Other Ambulatory Visit: Payer: Self-pay | Admitting: *Deleted

## 2023-05-23 MED ORDER — INFLECTRA 100 MG IV SOLR: INTRAVENOUS | 11 refills | Status: AC

## 2023-06-25 ENCOUNTER — Telehealth: Payer: Self-pay | Admitting: *Deleted

## 2023-06-25 NOTE — Telephone Encounter (Signed)
We received a phone call from [facility] with [facility]. Patient is currently at their location for his Inflectra infusion. He had a root canal on 06/03/23 and clearance is needed from Dr Adela Lank for patient to move forward with infusion. I have spoken to Dr Adela Lank who has given verbal orders for patient to move forward with inflectra as long as he is feeling well. I have advised Drinda Butts of this information as well. She has requested a faxed note with this information which is sent to fax [phone].

## 2023-08-20 ENCOUNTER — Encounter: Payer: Self-pay | Admitting: Gastroenterology

## 2023-08-20 NOTE — Telephone Encounter (Signed)
 Dr. General Kenner, OK for me to send updated order for Remicade  5 mg/kg IV every 8 weeks to be infused over 1 hour instead of 2?

## 2023-08-21 ENCOUNTER — Telehealth: Payer: Self-pay

## 2023-08-21 NOTE — Telephone Encounter (Signed)
 Per pt request - Inflectra  infusion over 1 hour instead of 2 hours. No dose change, only rate of infusion has changed.  Updated Plan of Treatment faxed to [facility] at [phone]

## 2023-08-21 NOTE — Telephone Encounter (Signed)
 Updated Plan of Treatment faxed to [facility] - see 2/6 telephone encounter for details

## 2023-09-10 ENCOUNTER — Ambulatory Visit: Payer: Self-pay | Admitting: Orthopedic Surgery

## 2023-09-10 DIAGNOSIS — M5126 Other intervertebral disc displacement, lumbar region: Secondary | ICD-10-CM

## 2023-09-10 NOTE — Progress Notes (Signed)
 Surgical Instructions   Your procedure is scheduled on Wednesday September 17, 2023. Report to [facility] Main Entrance "A" at 6:30 A.M., then check in with the Admitting office. Any questions or running late day of surgery: call [phone]  Questions prior to your surgery date: call [phone], Monday-Friday, 8am-4pm. If you experience any cold or flu symptoms such as cough, fever, chills, shortness of breath, etc. between now and your scheduled surgery, please notify us at the above number.     Remember:  Do not eat after midnight the night before your surgery  You may drink clear liquids until 5:30 the morning of your surgery.   Clear liquids allowed are: Water, Non-Citrus Juices (without pulp), Carbonated Beverages, Clear Tea (no milk, honey, etc.), Black Coffee Only (NO MILK, CREAM OR POWDERED CREAMER of any kind), and Gatorade.    Take these medicines the morning of surgery with A SIP OF WATER  citalopram (CELEXA)   DO NOT TAKE YOUR OZEMPIC 7 DAYS PRIOR TO SURGERY, WITH THE LAST DOSE BEING NO LATER THAN 09/09/2023.     One week prior to surgery, STOP taking any Aspirin (unless otherwise instructed by your surgeon) Aleve, Naproxen, Ibuprofen, Motrin, Advil, Goody's, BC's, all herbal medications, fish oil, and non-prescription vitamins.                     Do NOT Smoke (Tobacco/Vaping) for 24 hours prior to your procedure.  If you use a CPAP at night, you may bring your mask/headgear for your overnight stay.   You will be asked to remove any contacts, glasses, piercing's, hearing aid's, dentures/partials prior to surgery. Please bring cases for these items if needed.    Patients discharged the day of surgery will not be allowed to drive home, and someone needs to stay with them for 24 hours.  SURGICAL WAITING ROOM VISITATION Patients may have no more than 2 support people in the waiting area - these visitors may rotate.   Pre-op nurse will coordinate an appropriate time for 1  ADULT support person, who may not rotate, to accompany patient in pre-op.  Children under the age of 50 must have an adult with them who is not the patient and must remain in the main waiting area with an adult.  If the patient needs to stay at the hospital during part of their recovery, the visitor guidelines for inpatient rooms apply.  Please refer to the [facility] website for the visitor guidelines for any additional information.   If you received a COVID test during your pre-op visit  it is requested that you wear a mask when out in public, stay away from anyone that may not be feeling well and notify your surgeon if you develop symptoms. If you have been in contact with anyone that has tested positive in the last 10 days please notify you surgeon.      Pre-operative 5 CHG Bathing Instructions   You can play a key role in reducing the risk of infection after surgery. Your skin needs to be as free of germs as possible. You can reduce the number of germs on your skin by washing with CHG (chlorhexidine gluconate) soap before surgery. CHG is an antiseptic soap that kills germs and continues to kill germs even after washing.   DO NOT use if you have an allergy to chlorhexidine/CHG or antibacterial soaps. If your skin becomes reddened or irritated, stop using the CHG and notify one of our RNs at [phone].  Please shower with the CHG soap starting 4 days before surgery using the following schedule:     Please keep in mind the following:  DO NOT shave, including legs and underarms, starting the day of your first shower.   You may shave your face at any point before/day of surgery.  Place clean sheets on your bed the day you start using CHG soap. Use a clean washcloth (not used since being washed) for each shower. DO NOT sleep with pets once you start using the CHG.   CHG Shower Instructions:  Wash your face and private area with normal soap. If you choose to wash your hair, wash  first with your normal shampoo.  After you use shampoo/soap, rinse your hair and body thoroughly to remove shampoo/soap residue.  Turn the water OFF and apply about 3 tablespoons (45 ml) of CHG soap to a CLEAN washcloth.  Apply CHG soap ONLY FROM YOUR NECK DOWN TO YOUR TOES (washing for 3-5 minutes)  DO NOT use CHG soap on face, private areas, open wounds, or sores.  Pay special attention to the area where your surgery is being performed.  If you are having back surgery, having someone wash your back for you may be helpful. Wait 2 minutes after CHG soap is applied, then you may rinse off the CHG soap.  Pat dry with a clean towel  Put on clean clothes/pajamas   If you choose to wear lotion, please use ONLY the CHG-compatible lotions that are listed below.  Additional instructions for the day of surgery: DO NOT APPLY any lotions, deodorants or cologne   Do not bring valuables to the hospital. [facility] is not responsible for any belongings/valuables. Do not wear jewelry Put on clean/comfortable clothes.  Please brush your teeth.  Ask your nurse before applying any prescription medications to the skin.     CHG Compatible Lotions   Aveeno Moisturizing lotion  Cetaphil Moisturizing Cream  Cetaphil Moisturizing Lotion  Clairol Herbal Essence Moisturizing Lotion, Dry Skin  Clairol Herbal Essence Moisturizing Lotion, Extra Dry Skin  Clairol Herbal Essence Moisturizing Lotion, Normal Skin  Curel Age Defying Therapeutic Moisturizing Lotion with Alpha Hydroxy  Curel Extreme Care Body Lotion  Curel Soothing Hands Moisturizing Hand Lotion  Curel Therapeutic Moisturizing Cream, Fragrance-Free  Curel Therapeutic Moisturizing Lotion, Fragrance-Free  Curel Therapeutic Moisturizing Lotion, Original Formula  Eucerin Daily Replenishing Lotion  Eucerin Dry Skin Therapy Plus Alpha Hydroxy Crme  Eucerin Dry Skin Therapy Plus Alpha Hydroxy Lotion  Eucerin Original Crme  Eucerin Original Lotion   Eucerin Plus Crme Eucerin Plus Lotion  Eucerin TriLipid Replenishing Lotion  Keri Anti-Bacterial Hand Lotion  Keri Deep Conditioning Original Lotion Dry Skin Formula Softly Scented  Keri Deep Conditioning Original Lotion, Fragrance Free Sensitive Skin Formula  Keri Lotion Fast Absorbing Fragrance Free Sensitive Skin Formula  Keri Lotion Fast Absorbing Softly Scented Dry Skin Formula  Keri Original Lotion  Keri Skin Renewal Lotion Keri Silky Smooth Lotion  Keri Silky Smooth Sensitive Skin Lotion  Nivea Body Creamy Conditioning Oil  Nivea Body Extra Enriched Lotion  Nivea Body Original Lotion  Nivea Body Sheer Moisturizing Lotion Nivea Crme  Nivea Skin Firming Lotion  NutraDerm 30 Skin Lotion  NutraDerm Skin Lotion  NutraDerm Therapeutic Skin Cream  NutraDerm Therapeutic Skin Lotion  ProShield Protective Hand Cream  Provon moisturizing lotion  Please read over the following fact sheets that you were given.

## 2023-09-11 ENCOUNTER — Encounter (HOSPITAL_COMMUNITY)
Admission: RE | Admit: 2023-09-11 | Discharge: 2023-09-11 | Disposition: A | Discharge status: Home / Self Care | Payer: Commercial Managed Care - PPO | Source: Ambulatory Visit | Attending: Specialist | Admitting: Specialist

## 2023-09-11 ENCOUNTER — Encounter (HOSPITAL_COMMUNITY): Payer: Self-pay

## 2023-09-11 ENCOUNTER — Ambulatory Visit (HOSPITAL_COMMUNITY)
Admission: RE | Admit: 2023-09-11 | Discharge: 2023-09-11 | Disposition: A | Discharge status: Home / Self Care | Payer: Commercial Managed Care - PPO | Source: Ambulatory Visit | Attending: Orthopedic Surgery | Admitting: Orthopedic Surgery

## 2023-09-11 ENCOUNTER — Other Ambulatory Visit: Payer: Self-pay

## 2023-09-11 ENCOUNTER — Ambulatory Visit: Payer: Self-pay | Admitting: Orthopedic Surgery

## 2023-09-11 ENCOUNTER — Other Ambulatory Visit (HOSPITAL_COMMUNITY): Payer: Commercial Managed Care - PPO

## 2023-09-11 VITALS — BP 148/90 | HR 89 | Temp 97.7°F | Resp 19 | Ht 75.0 in | Wt 335.4 lb

## 2023-09-11 DIAGNOSIS — F419 Anxiety disorder, unspecified: Secondary | ICD-10-CM | POA: Insufficient documentation

## 2023-09-11 DIAGNOSIS — I10 Essential (primary) hypertension: Secondary | ICD-10-CM | POA: Diagnosis not present

## 2023-09-11 DIAGNOSIS — M5126 Other intervertebral disc displacement, lumbar region: Secondary | ICD-10-CM | POA: Diagnosis not present

## 2023-09-11 DIAGNOSIS — Q7649 Other congenital malformations of spine, not associated with scoliosis: Secondary | ICD-10-CM | POA: Diagnosis not present

## 2023-09-11 DIAGNOSIS — Z01818 Encounter for other preprocedural examination: Secondary | ICD-10-CM | POA: Insufficient documentation

## 2023-09-11 DIAGNOSIS — Z9889 Other specified postprocedural states: Secondary | ICD-10-CM | POA: Insufficient documentation

## 2023-09-11 DIAGNOSIS — K5190 Ulcerative colitis, unspecified, without complications: Secondary | ICD-10-CM | POA: Diagnosis not present

## 2023-09-11 DIAGNOSIS — Z01810 Encounter for preprocedural cardiovascular examination: Secondary | ICD-10-CM | POA: Diagnosis present

## 2023-09-11 DIAGNOSIS — Z6841 Body Mass Index (BMI) 40.0 and over, adult: Secondary | ICD-10-CM | POA: Diagnosis not present

## 2023-09-11 DIAGNOSIS — E119 Type 2 diabetes mellitus without complications: Secondary | ICD-10-CM | POA: Diagnosis not present

## 2023-09-11 DIAGNOSIS — K760 Fatty (change of) liver, not elsewhere classified: Secondary | ICD-10-CM | POA: Diagnosis not present

## 2023-09-11 DIAGNOSIS — Z79899 Other long term (current) drug therapy: Secondary | ICD-10-CM | POA: Insufficient documentation

## 2023-09-11 DIAGNOSIS — Z01812 Encounter for preprocedural laboratory examination: Secondary | ICD-10-CM | POA: Diagnosis present

## 2023-09-11 LAB — CBC
HCT: 47.5 % (ref 39.0–52.0)
Hemoglobin: 16.3 g/dL (ref 13.0–17.0)
MCH: 29.9 pg (ref 26.0–34.0)
MCHC: 34.3 g/dL (ref 30.0–36.0)
MCV: 87.2 fL (ref 80.0–100.0)
Platelets: 337 10*3/uL (ref 150–400)
RBC: 5.45 MIL/uL (ref 4.22–5.81)
RDW: 12.6 % (ref 11.5–15.5)
WBC: 11.9 10*3/uL — ABNORMAL HIGH (ref 4.0–10.5)
nRBC: 0 % (ref 0.0–0.2)

## 2023-09-11 LAB — COMPREHENSIVE METABOLIC PANEL
ALT: 79 U/L — ABNORMAL HIGH (ref 0–44)
AST: 43 U/L — ABNORMAL HIGH (ref 15–41)
Albumin: 4.1 g/dL (ref 3.5–5.0)
Alkaline Phosphatase: 37 U/L — ABNORMAL LOW (ref 38–126)
Anion gap: 13 (ref 5–15)
BUN: 14 mg/dL (ref 6–20)
CO2: 22 mmol/L (ref 22–32)
Calcium: 9.4 mg/dL (ref 8.9–10.3)
Chloride: 103 mmol/L (ref 98–111)
Creatinine, Ser: 0.94 mg/dL (ref 0.61–1.24)
GFR, Estimated: >60 mL/min (ref >60)
Glucose, Bld: 85 mg/dL (ref 70–99)
Potassium: 3.9 mmol/L (ref 3.5–5.1)
Sodium: 138 mmol/L (ref 135–145)
Total Bilirubin: 0.6 mg/dL (ref 0.0–1.2)
Total Protein: 7.2 g/dL (ref 6.5–8.1)

## 2023-09-11 LAB — SURGICAL PCR SCREEN
MRSA, PCR: NEGATIVE
Staphylococcus aureus: NEGATIVE

## 2023-09-11 LAB — GLUCOSE, CAPILLARY: Glucose-Capillary: 71 mg/dL (ref 70–99)

## 2023-09-11 LAB — HEMOGLOBIN A1C
Hgb A1c MFr Bld: 5.4 % (ref 4.8–5.6)
Mean Plasma Glucose: 108.28 mg/dL

## 2023-09-11 NOTE — H&P (Signed)
 [name] is an 38 y.o. male.   Chief Complaint: back and leg pain HPI: Reported by patient. Reason for Visit: (normal) review of test results (lumbar MRI); 6 months Location (Lower Extremity): lower back pain on the right; leg pain on the right, , ; right hip Severity: pain level 8/10 Aggravating Factors: standing for ; walking for ; sitting for Associated Symptoms: numbness/tingling; weakness (right foot) Medications: helping a little (with sleep); Gabapentin 300mg  qhs Having difficulty heel walking on the right. No change in bowel or bladder function fevers or chills intermittent numbness only in the right leg. Worse with sitting  Past Medical History:  Diagnosis Date   Colitis    Diabetes mellitus without complication (HCC)    on meds   Fatty liver    Hypertension    on meds   Morbid obesity (HCC)    Panic anxiety syndrome    on meds    Past Surgical History:  Procedure Laterality Date   COLONOSCOPY N/A 05/24/2016   Procedure: COLONOSCOPY;  Surgeon: Rachael Fee, MD;  Location: [facility];  Service: Endoscopy;  Laterality: N/A;   ESOPHAGOGASTRODUODENOSCOPY N/A 05/24/2016   Procedure: ESOPHAGOGASTRODUODENOSCOPY (EGD);  Surgeon: Rachael Fee, MD;  Location: [facility];  Service: Endoscopy;  Laterality: N/A;   LUMBAR LAMINECTOMY/DECOMPRESSION MICRODISCECTOMY N/A 02/14/2016   Procedure: MICRO  LUMBER L4-5;  Surgeon: Jene Every, MD;  Location: [facility];  Service: Orthopedics;  Laterality: N/A;    Family History  Problem Relation Age of Onset   Hypertension Mother    Diabetes Mother    Hyperlipidemia Mother    Hypertension Father    Diabetes Father    High Cholesterol Maternal Grandmother    Hypertension Maternal Grandmother    Crohn's disease Maternal Grandmother    Heart disease Maternal Grandfather    High Cholesterol Maternal Grandfather    Hypertension Maternal Grandfather    Stroke Maternal Grandfather    Cancer Paternal Grandmother         unknown   Diabetes Paternal Grandmother    Heart disease Paternal Grandmother    High Cholesterol Paternal Grandmother    Hypertension Paternal Grandmother    Heart disease Paternal Grandfather    High Cholesterol Paternal Grandfather    Hypertension Paternal Grandfather    Stroke Paternal Grandfather    Colon cancer Neg Hx    Stomach cancer Neg Hx    Esophageal cancer Neg Hx    Pancreatic cancer Neg Hx    Liver disease Neg Hx    Social History:  reports that he has never smoked. He has never used smokeless tobacco. He reports current alcohol use of about 2.0 standard drinks of alcohol per week. He reports that he does not use drugs.  Allergies:  Allergies  Allergen Reactions   Sulfa Antibiotics Other (See Comments)    Unsure- as a child   Penicillin G Hives, Itching and Rash   Penicillins Hives, Itching and Rash    Has patient had a PCN reaction causing immediate rash, facial/tongue/throat swelling, SOB or lightheadedness with hypotension: no Has patient had a PCN reaction causing severe rash involving mucus membranes or skin necrosis: no Has patient had a PCN reaction that required hospitalization no Has patient had a PCN reaction occurring within the last 10 years: yes If all of the above answers are "NO", then may proceed with Cephalosporin use.   Current meds: citalopram lisinopriL 20 mg tablet Neurontin 300 mg capsule Ozempic 2 mg/dose (8 mg/3 mL) subcutaneous pen  injector Remicade  Review of Systems  Constitutional: Negative.   HENT: Negative.    Eyes: Negative.   Respiratory: Negative.    Cardiovascular: Negative.   Gastrointestinal: Negative.   Endocrine: Negative.   Genitourinary: Negative.   Musculoskeletal:  Positive for back pain and gait problem.  Skin: Negative.   Neurological:  Positive for weakness and numbness.  Hematological: Negative.   Psychiatric/Behavioral: Negative.      There were no vitals taken for this visit. Physical  Exam Constitutional:      Appearance: Normal appearance.  HENT:     Head: Normocephalic and atraumatic.     Right Ear: External ear normal.     Left Ear: External ear normal.     Nose: Nose normal.     Mouth/Throat:     Pharynx: Oropharynx is clear.  Eyes:     Conjunctiva/sclera: Conjunctivae normal.  Cardiovascular:     Rate and Rhythm: Normal rate.     Pulses: Normal pulses.     Heart sounds: Normal heart sounds.  Pulmonary:     Effort: Pulmonary effort is normal.  Abdominal:     General: Bowel sounds are normal.  Musculoskeletal:     Cervical back: Normal range of motion.     Comments: Straight leg raise positive. Is actually able to stand on his heels keep his foot off the ground.  Neurological:     Mental Status: He is alert.    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 Congenitally short pedicles.  Assessment/Plan Impression:  1. 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 History of lumbar decompression L4-5 sacralized lumbar vertebral body  Plan:  I discussed options and given the large disc herniation causing severe spinal stenosis with weakness and can generally short pedicles I do not feel this will be amenable to injection therapy or continued conservative treatment.  We mutually agreed to proceed with lumbar microdiscectomy I had an extensive discussion with the patient concerning the pathology relevant anatomy and treatment options. At this point exhausting conservative treatment and in the presence of a neurologic deficit we discussed microlumbar decompression. I discussed the risks and benefits including bleeding, infection, DVT, PE, anesthetic complications, worsening in  their symptoms, improvement in their symptoms, C SF leakage, epidural fibrosis, need for future surgeries such as revision discectomy and lumbar fusion. I also indicated that this is an operation to basically decompress the nerve roots to allow recovery as opposed to fixing a herniated disc if it is encountered and that the incidence of recurrent chest disc herniation can approach 15%. Also that nerve root recovery is variable and may not recover completely. Any ligament or bone that is contributing to compressing the nerves will be removed as well.  I discussed the operative course including overnight in the hospital. Immediate ambulation. Follow-up in 2 weeks for suture removal. 6 weeks until healing of the herniation and surgical incision followed by 6 weeks of reconditioning and strengthening of the core musculature. Also discussed the need to employ the concepts of disc pressure management and core motion following the surgery to minimize the risk of recurrent disc herniation. We will obtain preoperative clearance i if necessary and proceed accordingly.  No history of DVT or MRSA. His wife works in the [employer]. They are  expecting.  Plan microdiscectomy L3-4  Dorothy Spark, PA-C for Dr Shelle Iron 09/11/2023, 11:35 AM

## 2023-09-11 NOTE — Progress Notes (Addendum)
 PCP - Carley Hammed, FNP Cardiologist - denies  PPM/ICD - denies   Chest x-ray - 03/29/13 EKG - 09/11/23 Stress Test - denies ECHO - denies Cardiac Cath - denies  Sleep Study - denies   DM- pt does not check CBG at home and does not know typical fasting levels  Last dose of GLP1 agonist-  09/09/23 GLP1 instructions: Pt knows not to take any further doses prior to surgery  ASA/Blood Thinner Instructions: n/a   ERAS Protcol - yes PRE-SURGERY G2- given at PAT  COVID TEST- n/a   Anesthesia review: yes, abnormal liver enzyme labs  Patient denies shortness of breath, fever, cough and chest pain at PAT appointment   All instructions explained to the patient, with a verbal understanding of the material. Patient agrees to go over the instructions while at home for a better understanding.  The opportunity to ask questions was provided.

## 2023-09-11 NOTE — Progress Notes (Signed)
 Surgical Instructions   Your procedure is scheduled on Wednesday September 17, 2023. Report to [facility] Main Entrance "A" at 6:30 A.M., then check in with the Admitting office. Any questions or running late day of surgery: call [phone]  Questions prior to your surgery date: call [phone], Monday-Friday, 8am-4pm. If you experience any cold or flu symptoms such as cough, fever, chills, shortness of breath, etc. between now and your scheduled surgery, please notify us at the above number.     Remember:  Do not eat after midnight the night before your surgery  You may drink clear liquids until 5:30 the morning of your surgery.   Clear liquids allowed are: Water, Non-Citrus Juices (without pulp), Carbonated Beverages, Clear Tea (no milk, honey, etc.), Black Coffee Only (NO MILK, CREAM OR POWDERED CREAMER of any kind), and Gatorade.  Patient Instructions  The night before surgery:  No food after midnight. ONLY clear liquids after midnight   The day of surgery (if you have diabetes): Drink ONE (1) 12 oz G2 given to you in your pre admission testing appointment by 05:30 AM the morning of surgery. Drink in one sitting. Do not sip.  This drink was given to you during your hospital  pre-op appointment visit.  Nothing else to drink after completing the  12 oz bottle of G2.         If you have questions, please contact your surgeon's office.    Take these medicines the morning of surgery with A SIP OF WATER  citalopram (CELEXA)   DO NOT TAKE YOUR OZEMPIC 7 DAYS PRIOR TO SURGERY, WITH THE LAST DOSE BEING NO LATER THAN 09/09/2023.     One week prior to surgery, STOP taking any Aspirin (unless otherwise instructed by your surgeon) Aleve, Naproxen, Ibuprofen, Motrin, Advil, Goody's, BC's, all herbal medications, fish oil, and non-prescription vitamins.                     Do NOT Smoke (Tobacco/Vaping) for 24 hours prior to your procedure.  If you use a CPAP at night, you may bring your  mask/headgear for your overnight stay.   You will be asked to remove any contacts, glasses, piercing's, hearing aid's, dentures/partials prior to surgery. Please bring cases for these items if needed.    Patients discharged the day of surgery will not be allowed to drive home, and someone needs to stay with them for 24 hours.  SURGICAL WAITING ROOM VISITATION Patients may have no more than 2 support people in the waiting area - these visitors may rotate.   Pre-op nurse will coordinate an appropriate time for 1 ADULT support person, who may not rotate, to accompany patient in pre-op.  Children under the age of 60 must have an adult with them who is not the patient and must remain in the main waiting area with an adult.  If the patient needs to stay at the hospital during part of their recovery, the visitor guidelines for inpatient rooms apply.  Please refer to the [facility] website for the visitor guidelines for any additional information.   If you received a COVID test during your pre-op visit  it is requested that you wear a mask when out in public, stay away from anyone that may not be feeling well and notify your surgeon if you develop symptoms. If you have been in contact with anyone that has tested positive in the last 10 days please notify you surgeon.  Pre-operative 5 CHG Bathing Instructions   You can play a key role in reducing the risk of infection after surgery. Your skin needs to be as free of germs as possible. You can reduce the number of germs on your skin by washing with CHG (chlorhexidine gluconate) soap before surgery. CHG is an antiseptic soap that kills germs and continues to kill germs even after washing.   DO NOT use if you have an allergy to chlorhexidine/CHG or antibacterial soaps. If your skin becomes reddened or irritated, stop using the CHG and notify one of our RNs at [phone].   Please shower with the CHG soap starting 4 days before surgery using the  following schedule:     Please keep in mind the following:  DO NOT shave, including legs and underarms, starting the day of your first shower.   You may shave your face at any point before/day of surgery.  Place clean sheets on your bed the day you start using CHG soap. Use a clean washcloth (not used since being washed) for each shower. DO NOT sleep with pets once you start using the CHG.   CHG Shower Instructions:  Wash your face and private area with normal soap. If you choose to wash your hair, wash first with your normal shampoo.  After you use shampoo/soap, rinse your hair and body thoroughly to remove shampoo/soap residue.  Turn the water OFF and apply about 3 tablespoons (45 ml) of CHG soap to a CLEAN washcloth.  Apply CHG soap ONLY FROM YOUR NECK DOWN TO YOUR TOES (washing for 3-5 minutes)  DO NOT use CHG soap on face, private areas, open wounds, or sores.  Pay special attention to the area where your surgery is being performed.  If you are having back surgery, having someone wash your back for you may be helpful. Wait 2 minutes after CHG soap is applied, then you may rinse off the CHG soap.  Pat dry with a clean towel  Put on clean clothes/pajamas   If you choose to wear lotion, please use ONLY the CHG-compatible lotions that are listed below.  Additional instructions for the day of surgery: DO NOT APPLY any lotions, deodorants or cologne   Do not bring valuables to the hospital. [facility] is not responsible for any belongings/valuables. Do not wear jewelry Put on clean/comfortable clothes.  Please brush your teeth.  Ask your nurse before applying any prescription medications to the skin.     CHG Compatible Lotions   Aveeno Moisturizing lotion  Cetaphil Moisturizing Cream  Cetaphil Moisturizing Lotion  Clairol Herbal Essence Moisturizing Lotion, Dry Skin  Clairol Herbal Essence Moisturizing Lotion, Extra Dry Skin  Clairol Herbal Essence Moisturizing Lotion,  Normal Skin  Curel Age Defying Therapeutic Moisturizing Lotion with Alpha Hydroxy  Curel Extreme Care Body Lotion  Curel Soothing Hands Moisturizing Hand Lotion  Curel Therapeutic Moisturizing Cream, Fragrance-Free  Curel Therapeutic Moisturizing Lotion, Fragrance-Free  Curel Therapeutic Moisturizing Lotion, Original Formula  Eucerin Daily Replenishing Lotion  Eucerin Dry Skin Therapy Plus Alpha Hydroxy Crme  Eucerin Dry Skin Therapy Plus Alpha Hydroxy Lotion  Eucerin Original Crme  Eucerin Original Lotion  Eucerin Plus Crme Eucerin Plus Lotion  Eucerin TriLipid Replenishing Lotion  Keri Anti-Bacterial Hand Lotion  Keri Deep Conditioning Original Lotion Dry Skin Formula Softly Scented  Keri Deep Conditioning Original Lotion, Fragrance Free Sensitive Skin Formula  Keri Lotion Fast Absorbing Fragrance Free Sensitive Skin Formula  Keri Lotion Fast Absorbing Softly Scented Dry Skin Formula  Lorina Rabon  Original Lotion  [employer] Skin Renewal Lotion Keri Silky Smooth Lotion  Keri Silky Smooth Sensitive Skin Lotion  Nivea Body Creamy Conditioning Oil  Nivea Body Extra Enriched Teacher, adult education Moisturizing Lotion Nivea Crme  Nivea Skin Firming Lotion  NutraDerm 30 Skin Lotion  NutraDerm Skin Lotion  NutraDerm Therapeutic Skin Cream  NutraDerm Therapeutic Skin Lotion  ProShield Protective Hand Cream  Provon moisturizing lotion  Please read over the following fact sheets that you were given.

## 2023-09-12 ENCOUNTER — Encounter (HOSPITAL_COMMUNITY): Payer: Self-pay

## 2023-09-12 ENCOUNTER — Ambulatory Visit: Payer: Self-pay | Admitting: Orthopedic Surgery

## 2023-09-12 NOTE — Progress Notes (Signed)
 Anesthesia Chart Review:  Case: [id number] Date/Time: 09/17/23 0815   Procedure: Microdiscectomy L3-4 - 3 C-Bed   Anesthesia type: General   Pre-op diagnosis: Herniated disc L3-4   [facility] ROOM 10 / [facility]   Surgeons: Jene Every, MD       DISCUSSION: Patient is a 38 year old male scheduled for the above procedure.  History includes never smoker, HTN, DM2, fatty liver, ulcerative colitis (diagnosed 2017, on Inflectra IV Q 8 weeks), anxiety, morbid obesity, spinal surgery (left L4-5 foraminotomies/microdiscectomy 02/14/16).  Notes in [facility] suggest last Inflectra was on 08/20/23 at the [facility]  He had primary care clearance for surgery by Hite, Jackquline Berlin, FNP  (scanned under Media tab).   Last GLP1 agonist 09/09/23 and instructed at PAT to hold until after surgery.  Anesthesia team to evaluate on the day of surgery.   VS: BP (!) 148/90   Pulse 89   Temp 36.5 C   Resp 19   Ht 6\' 3"  (1.905 m)   Wt (!) 152.1 kg   SpO2 98%   BMI 41.92 kg/m    PROVIDERS: Hite, Jackquline Berlin, FNP is PCP  Ileene Patrick, MD is GI   LABS: Preoperative labs noted. AST 43 and ALT 79, previous AST 26-37 and ALT 38-53 when compared to labs since 12/11/21. PLT count normal. A1c 5.4%.  (all labs ordered are listed, but only abnormal results are displayed)  Labs Reviewed  COMPREHENSIVE METABOLIC PANEL - Abnormal; Notable for the following components:      Result Value   AST 43 (*)    ALT 79 (*)    Alkaline Phosphatase 37 (*)    All other components within normal limits  CBC - Abnormal; Notable for the following components:   WBC 11.9 (*)    All other components within normal limits  SURGICAL PCR SCREEN  GLUCOSE, CAPILLARY  HEMOGLOBIN A1C    IMAGES: Xray L-spine 09/11/23: Report in process.   EKG: 09/11/23: NSR   CV: N/A  Past Medical History:  Diagnosis Date   Colitis    ulcerative colitis   Diabetes mellitus without complication (HCC)    on meds    Fatty liver    Hypertension    on meds   Morbid obesity (HCC)    Panic anxiety syndrome    on meds    Past Surgical History:  Procedure Laterality Date   COLONOSCOPY N/A 05/24/2016   Procedure: COLONOSCOPY;  Surgeon: Rachael Fee, MD;  Location: [facility];  Service: Endoscopy;  Laterality: N/A;   ESOPHAGOGASTRODUODENOSCOPY N/A 05/24/2016   Procedure: ESOPHAGOGASTRODUODENOSCOPY (EGD);  Surgeon: Rachael Fee, MD;  Location: [facility];  Service: Endoscopy;  Laterality: N/A;   LASIK  2020   LUMBAR LAMINECTOMY/DECOMPRESSION MICRODISCECTOMY N/A 02/14/2016   Procedure: MICRO  LUMBER L4-5;  Surgeon: Jene Every, MD;  Location: [facility] ORS;  Service: Orthopedics;  Laterality: N/A;    MEDICATIONS:  oxyCODONE (OXY IR/ROXICODONE) 5 MG immediate release tablet   citalopram (CELEXA) 20 MG tablet   gabapentin (NEURONTIN) 300 MG capsule   inFLIXimab-dyyb (INFLECTRA) 100 MG SOLR   lisinopril (ZESTRIL) 20 MG tablet   OZEMPIC, 2 MG/DOSE, 8 MG/3ML SOPN   No current facility-administered medications for this encounter.    Shonna Chock, PA-C Surgical Short Stay/Anesthesiology [facility] Phone [phone] [facility] Phone [phone] 09/12/2023 2:50 PM

## 2023-09-12 NOTE — Anesthesia Preprocedure Evaluation (Addendum)
 Anesthesia Evaluation  Patient identified by MRN, date of birth, ID band Patient awake    Reviewed: Allergy & Precautions, NPO status , Patient's Chart, lab work & pertinent test results  Airway Mallampati: IV  TM Distance: >3 FB Neck ROM: Full    Dental no notable dental hx. (+) Teeth Intact, Dental Advisory Given   Pulmonary neg pulmonary ROS   Pulmonary exam normal breath sounds clear to auscultation       Cardiovascular hypertension (146/94 preop), Pt. on medications Normal cardiovascular exam Rhythm:Regular Rate:Normal     Neuro/Psych  PSYCHIATRIC DISORDERS Anxiety     negative neurological ROS     GI/Hepatic Neg liver ROS, PUD,,,  Endo/Other  diabetes, Well Controlled, Type 2  Class 3 obesityBMI 41  Renal/GU negative Renal ROS  negative genitourinary   Musculoskeletal Herniated disc L3-4   Abdominal  (+) + obese  Peds  Hematology Hb 16.3, plt 337   Anesthesia Other Findings Ozempic LD:   Reproductive/Obstetrics negative OB ROS                             Anesthesia Physical Anesthesia Plan  ASA: 3  Anesthesia Plan: General   Post-op Pain Management: Tylenol PO (pre-op)*, Ketamine IV* and Toradol IV (intra-op)*   Induction: Intravenous  PONV Risk Score and Plan: 2 and Ondansetron, Dexamethasone, Midazolam and Treatment may vary due to age or medical condition  Airway Management Planned: Oral ETT  Additional Equipment: None  Intra-op Plan:   Post-operative Plan: Extubation in OR  Informed Consent: I have reviewed the patients History and Physical, chart, labs and discussed the procedure including the risks, benefits and alternatives for the proposed anesthesia with the patient or authorized representative who has indicated his/her understanding and acceptance.     Dental advisory given  Plan Discussed with: CRNA  Anesthesia Plan Comments: (  Last  airway: Ventilation: Mask ventilation without difficulty and Oral airway inserted - appropriate to patient size Laryngoscope Size: Mac and 4 Grade View: Grade II Tube type: Oral Tube size: 7.5 mm Number of attempts: 1 )       Anesthesia Quick Evaluation

## 2023-09-12 NOTE — H&P (View-Only) (Signed)
 Anesthesia Chart Review:  Case: [id number] Date/Time: 09/17/23 0815   Procedure: Microdiscectomy L3-4 - 3 C-Bed   Anesthesia type: General   Pre-op diagnosis: Herniated disc L3-4   [facility]   Surgeons: Jene Every, MD       DISCUSSION: Patient is a 38 year old male scheduled for the above procedure.  History includes never smoker, HTN, DM2, fatty liver, ulcerative colitis (diagnosed 2017, on Inflectra IV Q 8 weeks), anxiety, morbid obesity, spinal surgery (left L4-5 foraminotomies/microdiscectomy 02/14/16).  Notes in [facility] suggest last Inflectra was on 08/20/23 at the [facility]  He had primary care clearance for surgery by Hite, Jackquline Berlin, FNP  (scanned under Media tab).   Last GLP1 agonist 09/09/23 and instructed at PAT to hold until after surgery.  Anesthesia team to evaluate on the day of surgery.   VS: BP (!) 148/90   Pulse 89   Temp 36.5 C   Resp 19   Ht 6\' 3"  (1.905 m)   Wt (!) 152.1 kg   SpO2 98%   BMI 41.92 kg/m    PROVIDERS: Hite, Jackquline Berlin, FNP is PCP  Ileene Patrick, MD is GI   LABS: Preoperative labs noted. AST 43 and ALT 79, previous AST 26-37 and ALT 38-53 when compared to labs since 12/11/21. PLT count normal. A1c 5.4%.  (all labs ordered are listed, but only abnormal results are displayed)  Labs Reviewed  COMPREHENSIVE METABOLIC PANEL - Abnormal; Notable for the following components:      Result Value   AST 43 (*)    ALT 79 (*)    Alkaline Phosphatase 37 (*)    All other components within normal limits  CBC - Abnormal; Notable for the following components:   WBC 11.9 (*)    All other components within normal limits  SURGICAL PCR SCREEN  GLUCOSE, CAPILLARY  HEMOGLOBIN A1C    IMAGES: Xray L-spine 09/11/23: Report in process.   EKG: 09/11/23: NSR   CV: N/A  Past Medical History:  Diagnosis Date   Colitis    ulcerative colitis   Diabetes mellitus without complication (HCC)    on meds    Fatty liver    Hypertension    on meds   Morbid obesity (HCC)    Panic anxiety syndrome    on meds    Past Surgical History:  Procedure Laterality Date   COLONOSCOPY N/A 05/24/2016   Procedure: COLONOSCOPY;  Surgeon: Rachael Fee, MD;  Location[facility];  Service: Endoscopy;  Laterality: N/A;   ESOPHAGOGASTRODUODENOSCOPY N/A 05/24/2016   Procedure: ESOPHAGOGASTRODUODENOSCOPY (EGD);  Surgeon: Rachael Fee, MD;  Location: [facility];  Service: Endoscopy;  Laterality: N/A;   LASIK  2020   LUMBAR LAMINECTOMY/DECOMPRESSION MICRODISCECTOMY N/A 02/14/2016   Procedure: MICRO  LUMBER L4-5;  Surgeon: Jene Every, MD;  Location: [facility];  Service: Orthopedics;  Laterality: N/A;    MEDICATIONS:  oxyCODONE (OXY IR/ROXICODONE) 5 MG immediate release tablet   citalopram (CELEXA) 20 MG tablet   gabapentin (NEURONTIN) 300 MG capsule   inFLIXimab-dyyb (INFLECTRA) 100 MG SOLR   lisinopril (ZESTRIL) 20 MG tablet   OZEMPIC, 2 MG/DOSE, 8 MG/3ML SOPN   No current facility-administered medications for this encounter.    Shonna Chock, PA-C Surgical Short Stay/Anesthesiology [facility] Phone [phone] [facility] Phone [phone] 09/12/2023 2:50 PM

## 2023-09-17 ENCOUNTER — Encounter (HOSPITAL_COMMUNITY): Payer: Self-pay | Admitting: Specialist

## 2023-09-17 ENCOUNTER — Ambulatory Visit (HOSPITAL_BASED_OUTPATIENT_CLINIC_OR_DEPARTMENT_OTHER)

## 2023-09-17 ENCOUNTER — Ambulatory Visit (HOSPITAL_COMMUNITY)

## 2023-09-17 ENCOUNTER — Other Ambulatory Visit: Payer: Self-pay

## 2023-09-17 ENCOUNTER — Ambulatory Visit (HOSPITAL_COMMUNITY)
Admission: RE | Admit: 2023-09-17 | Discharge: 2023-09-17 | Disposition: A | Discharge status: Home / Self Care | Payer: Commercial Managed Care - PPO | Attending: Specialist | Admitting: Specialist

## 2023-09-17 ENCOUNTER — Encounter (HOSPITAL_COMMUNITY)
Admission: RE | Disposition: A | Discharge status: Home / Self Care | Payer: Self-pay | Source: Home / Self Care | Attending: Specialist

## 2023-09-17 ENCOUNTER — Ambulatory Visit (HOSPITAL_COMMUNITY): Payer: Self-pay | Admitting: Vascular Surgery

## 2023-09-17 DIAGNOSIS — M4800 Spinal stenosis, site unspecified: Secondary | ICD-10-CM | POA: Diagnosis not present

## 2023-09-17 DIAGNOSIS — K760 Fatty (change of) liver, not elsewhere classified: Secondary | ICD-10-CM | POA: Diagnosis not present

## 2023-09-17 DIAGNOSIS — I10 Essential (primary) hypertension: Secondary | ICD-10-CM | POA: Diagnosis not present

## 2023-09-17 DIAGNOSIS — K5190 Ulcerative colitis, unspecified, without complications: Secondary | ICD-10-CM | POA: Insufficient documentation

## 2023-09-17 DIAGNOSIS — M5126 Other intervertebral disc displacement, lumbar region: Secondary | ICD-10-CM

## 2023-09-17 DIAGNOSIS — E66813 Obesity, class 3: Secondary | ICD-10-CM | POA: Diagnosis not present

## 2023-09-17 DIAGNOSIS — Z6841 Body Mass Index (BMI) 40.0 and over, adult: Secondary | ICD-10-CM | POA: Diagnosis not present

## 2023-09-17 DIAGNOSIS — F419 Anxiety disorder, unspecified: Secondary | ICD-10-CM | POA: Insufficient documentation

## 2023-09-17 DIAGNOSIS — E119 Type 2 diabetes mellitus without complications: Secondary | ICD-10-CM

## 2023-09-17 DIAGNOSIS — Z7985 Long-term (current) use of injectable non-insulin antidiabetic drugs: Secondary | ICD-10-CM | POA: Diagnosis not present

## 2023-09-17 DIAGNOSIS — M5116 Intervertebral disc disorders with radiculopathy, lumbar region: Secondary | ICD-10-CM | POA: Insufficient documentation

## 2023-09-17 DIAGNOSIS — Z8711 Personal history of peptic ulcer disease: Secondary | ICD-10-CM | POA: Diagnosis not present

## 2023-09-17 DIAGNOSIS — Z79899 Other long term (current) drug therapy: Secondary | ICD-10-CM | POA: Diagnosis not present

## 2023-09-17 LAB — GLUCOSE, CAPILLARY
Glucose-Capillary: 94 mg/dL (ref 70–99)
Glucose-Capillary: 178 mg/dL — ABNORMAL HIGH (ref 70–99)

## 2023-09-17 SURGERY — LUMBAR LAMINECTOMY/DECOMPRESSION MICRODISCECTOMY 1 LEVEL
Anesthesia: General

## 2023-09-17 MED ORDER — MIDAZOLAM HCL 2 MG/2ML IJ SOLN
INTRAMUSCULAR | Status: AC
  Filled 2023-09-17: qty 2

## 2023-09-17 MED ORDER — ROCURONIUM BROMIDE 10 MG/ML (PF) SYRINGE
PREFILLED_SYRINGE | INTRAVENOUS | Status: AC
  Filled 2023-09-17: qty 10

## 2023-09-17 MED ORDER — OXYCODONE HCL 5 MG/5ML PO SOLN: 5.0000 mg | Freq: Once | ORAL | Status: AC | PRN

## 2023-09-17 MED ORDER — PHENYLEPHRINE HCL-NACL 20-0.9 MG/250ML-% IV SOLN
INTRAVENOUS | Status: DC | PRN
  Administered 2023-09-17: 20 ug/min via INTRAVENOUS

## 2023-09-17 MED ORDER — HYDROMORPHONE HCL 1 MG/ML IJ SOLN: 1.0000 mg | INTRAMUSCULAR | Status: DC | PRN

## 2023-09-17 MED ORDER — LISINOPRIL 20 MG PO TABS: 20.0000 mg | ORAL_TABLET | Freq: Every day | ORAL | Status: DC

## 2023-09-17 MED ORDER — MEPERIDINE HCL 25 MG/ML IJ SOLN: 6.2500 mg | INTRAMUSCULAR | Status: DC | PRN

## 2023-09-17 MED ORDER — OXYCODONE HCL 5 MG PO TABS
10.0000 mg | ORAL_TABLET | ORAL | Status: DC | PRN
  Administered 2023-09-17: 10 mg via ORAL
  Filled 2023-09-17: qty 2

## 2023-09-17 MED ORDER — DOCUSATE SODIUM 100 MG PO CAPS
100.0000 mg | ORAL_CAPSULE | Freq: Two times a day (BID) | ORAL | Status: DC
  Administered 2023-09-17: 100 mg via ORAL
  Filled 2023-09-17: qty 1

## 2023-09-17 MED ORDER — MIDAZOLAM HCL 2 MG/2ML IJ SOLN
INTRAMUSCULAR | Status: DC | PRN
  Administered 2023-09-17: 2 mg via INTRAVENOUS

## 2023-09-17 MED ORDER — ONDANSETRON HCL 4 MG/2ML IJ SOLN
INTRAMUSCULAR | Status: DC | PRN
  Administered 2023-09-17: 4 mg via INTRAVENOUS

## 2023-09-17 MED ORDER — EPHEDRINE 5 MG/ML INJ
INTRAVENOUS | Status: AC
  Filled 2023-09-17: qty 5

## 2023-09-17 MED ORDER — MAGNESIUM CITRATE PO SOLN: 1.0000 | Freq: Once | ORAL | Status: DC | PRN

## 2023-09-17 MED ORDER — ONDANSETRON HCL 4 MG/2ML IJ SOLN: 4.0000 mg | Freq: Four times a day (QID) | INTRAMUSCULAR | Status: DC | PRN

## 2023-09-17 MED ORDER — FENTANYL CITRATE (PF) 250 MCG/5ML IJ SOLN
INTRAMUSCULAR | Status: DC | PRN
Start: 2023-09-17 — End: 2023-09-17
  Administered 2023-09-17 (×2): 50 ug via INTRAVENOUS
  Administered 2023-09-17: 100 ug via INTRAVENOUS

## 2023-09-17 MED ORDER — BUPIVACAINE-EPINEPHRINE 0.5% -1:200000 IJ SOLN
INTRAMUSCULAR | Status: DC | PRN
  Administered 2023-09-17: 6.5 mL

## 2023-09-17 MED ORDER — CHLORHEXIDINE GLUCONATE 0.12 % MT SOLN
15.0000 mL | Freq: Once | OROMUCOSAL | Status: AC
  Administered 2023-09-17: 15 mL via OROMUCOSAL
  Filled 2023-09-17: qty 15

## 2023-09-17 MED ORDER — GABAPENTIN 300 MG PO CAPS: 300.0000 mg | ORAL_CAPSULE | Freq: Every day | ORAL | Status: DC

## 2023-09-17 MED ORDER — HYDROMORPHONE HCL 1 MG/ML IJ SOLN: 0.2500 mg | INTRAMUSCULAR | Status: DC | PRN

## 2023-09-17 MED ORDER — SUGAMMADEX SODIUM 200 MG/2ML IV SOLN
INTRAVENOUS | Status: DC | PRN
  Administered 2023-09-17: 200 mg via INTRAVENOUS

## 2023-09-17 MED ORDER — PHENYLEPHRINE 80 MCG/ML (10ML) SYRINGE FOR IV PUSH (FOR BLOOD PRESSURE SUPPORT)
PREFILLED_SYRINGE | INTRAVENOUS | Status: DC | PRN
  Administered 2023-09-17 (×3): 80 ug via INTRAVENOUS

## 2023-09-17 MED ORDER — LACTATED RINGERS IV SOLN: INTRAVENOUS | Status: DC

## 2023-09-17 MED ORDER — TRANEXAMIC ACID-NACL 1000-0.7 MG/100ML-% IV SOLN
1000.0000 mg | INTRAVENOUS | Status: AC
  Administered 2023-09-17: 1000 mg via INTRAVENOUS
  Filled 2023-09-17: qty 100

## 2023-09-17 MED ORDER — GABAPENTIN 300 MG PO CAPS: 300.0000 mg | ORAL_CAPSULE | Freq: Two times a day (BID) | ORAL | Status: DC

## 2023-09-17 MED ORDER — ACETAMINOPHEN 650 MG RE SUPP: 650.0000 mg | RECTAL | Status: DC | PRN

## 2023-09-17 MED ORDER — METHOCARBAMOL 500 MG PO TABS
500.0000 mg | ORAL_TABLET | Freq: Four times a day (QID) | ORAL | Status: DC | PRN
  Administered 2023-09-17: 500 mg via ORAL
  Filled 2023-09-17: qty 1

## 2023-09-17 MED ORDER — THROMBIN (RECOMBINANT) 20000 UNITS EX SOLR
CUTANEOUS | Status: AC
  Filled 2023-09-17: qty 20000

## 2023-09-17 MED ORDER — BUPIVACAINE-EPINEPHRINE (PF) 0.5% -1:200000 IJ SOLN
INTRAMUSCULAR | Status: AC
  Filled 2023-09-17: qty 30

## 2023-09-17 MED ORDER — ALUM & MAG HYDROXIDE-SIMETH 200-200-20 MG/5ML PO SUSP: 30.0000 mL | Freq: Four times a day (QID) | ORAL | Status: DC | PRN

## 2023-09-17 MED ORDER — PROPOFOL 10 MG/ML IV BOLUS
INTRAVENOUS | Status: AC
  Filled 2023-09-17: qty 20

## 2023-09-17 MED ORDER — ONDANSETRON HCL 4 MG/2ML IJ SOLN
INTRAMUSCULAR | Status: AC
  Filled 2023-09-17: qty 2

## 2023-09-17 MED ORDER — METHOCARBAMOL 1000 MG/10ML IJ SOLN: 500.0000 mg | Freq: Four times a day (QID) | INTRAMUSCULAR | Status: DC | PRN

## 2023-09-17 MED ORDER — METHOCARBAMOL 500 MG PO TABS
ORAL_TABLET | ORAL | Status: AC
  Filled 2023-09-17: qty 1

## 2023-09-17 MED ORDER — OXYCODONE HCL 5 MG PO TABS
ORAL_TABLET | ORAL | Status: AC
  Filled 2023-09-17: qty 1

## 2023-09-17 MED ORDER — ACETAMINOPHEN 10 MG/ML IV SOLN
1000.0000 mg | INTRAVENOUS | Status: AC
  Administered 2023-09-17: 1000 mg via INTRAVENOUS
  Filled 2023-09-17: qty 100

## 2023-09-17 MED ORDER — GENTAMICIN IN SALINE 1.6-0.9 MG/ML-% IV SOLN
80.0000 mg | INTRAVENOUS | Status: DC
  Filled 2023-09-17: qty 50

## 2023-09-17 MED ORDER — PHENOL 1.4 % MT LIQD: 1.0000 | OROMUCOSAL | Status: DC | PRN

## 2023-09-17 MED ORDER — ORAL CARE MOUTH RINSE: 15.0000 mL | Freq: Once | OROMUCOSAL | Status: AC

## 2023-09-17 MED ORDER — AMISULPRIDE (ANTIEMETIC) 5 MG/2ML IV SOLN: 10.0000 mg | Freq: Once | INTRAVENOUS | Status: AC | PRN

## 2023-09-17 MED ORDER — ROCURONIUM BROMIDE 10 MG/ML (PF) SYRINGE
PREFILLED_SYRINGE | INTRAVENOUS | Status: DC | PRN
  Administered 2023-09-17: 5 mg via INTRAVENOUS
  Administered 2023-09-17: 20 mg via INTRAVENOUS
  Administered 2023-09-17: 5 mg via INTRAVENOUS
  Administered 2023-09-17: 10 mg via INTRAVENOUS
  Administered 2023-09-17: 100 mg via INTRAVENOUS
  Administered 2023-09-17: 5 mg via INTRAVENOUS

## 2023-09-17 MED ORDER — 0.9 % SODIUM CHLORIDE (POUR BTL) OPTIME
TOPICAL | Status: DC | PRN
  Administered 2023-09-17: 1000 mL

## 2023-09-17 MED ORDER — RISAQUAD PO CAPS
1.0000 | ORAL_CAPSULE | Freq: Every day | ORAL | Status: DC
Start: 2023-09-17 — End: 2023-09-17
  Administered 2023-09-17: 1 via ORAL
  Filled 2023-09-17: qty 1

## 2023-09-17 MED ORDER — CEFAZOLIN SODIUM-DEXTROSE 3-4 GM/150ML-% IV SOLN
INTRAVENOUS | Status: AC
  Filled 2023-09-17: qty 150

## 2023-09-17 MED ORDER — DEXAMETHASONE SODIUM PHOSPHATE 10 MG/ML IJ SOLN
INTRAMUSCULAR | Status: DC | PRN
  Administered 2023-09-17: 8 mg via INTRAVENOUS

## 2023-09-17 MED ORDER — VANCOMYCIN HCL 1500 MG/300ML IV SOLN
1500.0000 mg | INTRAVENOUS | Status: DC
  Filled 2023-09-17: qty 300

## 2023-09-17 MED ORDER — PHENYLEPHRINE 80 MCG/ML (10ML) SYRINGE FOR IV PUSH (FOR BLOOD PRESSURE SUPPORT)
PREFILLED_SYRINGE | INTRAVENOUS | Status: AC
  Filled 2023-09-17: qty 10

## 2023-09-17 MED ORDER — ALBUMIN HUMAN 5 % IV SOLN: INTRAVENOUS | Status: DC | PRN

## 2023-09-17 MED ORDER — MENTHOL 3 MG MT LOZG: 1.0000 | LOZENGE | OROMUCOSAL | Status: DC | PRN

## 2023-09-17 MED ORDER — KETAMINE HCL 10 MG/ML IJ SOLN
INTRAMUSCULAR | Status: DC | PRN
Start: 2023-09-17 — End: 2023-09-17
  Administered 2023-09-17: 40 mg via INTRAVENOUS

## 2023-09-17 MED ORDER — POTASSIUM CHLORIDE IN NACL 20-0.45 MEQ/L-% IV SOLN
INTRAVENOUS | Status: DC
  Filled 2023-09-17: qty 1000

## 2023-09-17 MED ORDER — POLYETHYLENE GLYCOL 3350 17 G PO PACK: 17.0000 g | PACK | Freq: Every day | ORAL | 0 refills | Status: DC

## 2023-09-17 MED ORDER — SUGAMMADEX SODIUM 200 MG/2ML IV SOLN
INTRAVENOUS | Status: AC
  Filled 2023-09-17: qty 2

## 2023-09-17 MED ORDER — LIDOCAINE 2% (20 MG/ML) 5 ML SYRINGE
INTRAMUSCULAR | Status: AC
  Filled 2023-09-17: qty 5

## 2023-09-17 MED ORDER — CEFAZOLIN SODIUM-DEXTROSE 3-4 GM/150ML-% IV SOLN
3.0000 g | Freq: Three times a day (TID) | INTRAVENOUS | Status: DC
  Filled 2023-09-17 (×3): qty 150

## 2023-09-17 MED ORDER — ONDANSETRON HCL 4 MG PO TABS: 4.0000 mg | ORAL_TABLET | Freq: Four times a day (QID) | ORAL | Status: DC | PRN

## 2023-09-17 MED ORDER — BISACODYL 5 MG PO TBEC: 5.0000 mg | DELAYED_RELEASE_TABLET | Freq: Every day | ORAL | Status: DC | PRN

## 2023-09-17 MED ORDER — FENTANYL CITRATE (PF) 250 MCG/5ML IJ SOLN
INTRAMUSCULAR | Status: AC
  Filled 2023-09-17: qty 5

## 2023-09-17 MED ORDER — DOCUSATE SODIUM 100 MG PO CAPS: 100.0000 mg | ORAL_CAPSULE | Freq: Two times a day (BID) | ORAL | 2 refills | Status: DC

## 2023-09-17 MED ORDER — LIDOCAINE 2% (20 MG/ML) 5 ML SYRINGE
INTRAMUSCULAR | Status: DC | PRN
  Administered 2023-09-17: 60 mg via INTRAVENOUS

## 2023-09-17 MED ORDER — ACETAMINOPHEN 325 MG PO TABS: 650.0000 mg | ORAL_TABLET | ORAL | Status: DC | PRN

## 2023-09-17 MED ORDER — AMISULPRIDE (ANTIEMETIC) 5 MG/2ML IV SOLN
INTRAVENOUS | Status: AC
  Administered 2023-09-17: 10 mg via INTRAVENOUS
  Filled 2023-09-17: qty 4

## 2023-09-17 MED ORDER — THROMBIN 20000 UNITS EX SOLR
CUTANEOUS | Status: DC | PRN
  Administered 2023-09-17: 20 mL via TOPICAL

## 2023-09-17 MED ORDER — OXYCODONE HCL 10 MG PO TABS: 10.0000 mg | ORAL_TABLET | ORAL | 0 refills | Status: AC | PRN

## 2023-09-17 MED ORDER — BUPIVACAINE-EPINEPHRINE (PF) 0.5% -1:200000 IJ SOLN
INTRAMUSCULAR | Status: AC
Start: 2023-09-17 — End: ?
  Filled 2023-09-17: qty 30

## 2023-09-17 MED ORDER — KETOROLAC TROMETHAMINE 30 MG/ML IJ SOLN: 30.0000 mg | Freq: Once | INTRAMUSCULAR | Status: DC | PRN

## 2023-09-17 MED ORDER — PROPOFOL 10 MG/ML IV BOLUS
INTRAVENOUS | Status: DC | PRN
  Administered 2023-09-17: 200 mg via INTRAVENOUS

## 2023-09-17 MED ORDER — ONDANSETRON HCL 4 MG/2ML IJ SOLN: 4.0000 mg | Freq: Once | INTRAMUSCULAR | Status: DC | PRN

## 2023-09-17 MED ORDER — CITALOPRAM HYDROBROMIDE 20 MG PO TABS: 20.0000 mg | ORAL_TABLET | Freq: Every day | ORAL | Status: DC

## 2023-09-17 MED ORDER — DEXAMETHASONE SODIUM PHOSPHATE 10 MG/ML IJ SOLN
INTRAMUSCULAR | Status: AC
  Filled 2023-09-17: qty 1

## 2023-09-17 MED ORDER — POLYETHYLENE GLYCOL 3350 17 G PO PACK: 17.0000 g | PACK | Freq: Every day | ORAL | Status: DC | PRN

## 2023-09-17 MED ORDER — KETAMINE HCL 50 MG/5ML IJ SOSY
PREFILLED_SYRINGE | INTRAMUSCULAR | Status: AC
  Filled 2023-09-17: qty 5

## 2023-09-17 MED ORDER — METHOCARBAMOL 750 MG PO TABS: 750.0000 mg | ORAL_TABLET | Freq: Three times a day (TID) | ORAL | 1 refills | Status: DC | PRN

## 2023-09-17 MED ORDER — OXYCODONE HCL 5 MG PO TABS: 5.0000 mg | ORAL_TABLET | ORAL | Status: DC | PRN

## 2023-09-17 MED ORDER — OXYCODONE HCL 5 MG PO TABS
5.0000 mg | ORAL_TABLET | Freq: Once | ORAL | Status: AC | PRN
  Administered 2023-09-17: 5 mg via ORAL

## 2023-09-17 MED ORDER — DEXTROSE 5 % IV SOLN
INTRAVENOUS | Status: DC | PRN
  Administered 2023-09-17: 3 g via INTRAVENOUS

## 2023-09-17 SURGICAL SUPPLY — 55 items
BAG COUNTER SPONGE SURGICOUNT (BAG) ×1 IMPLANT
BAG DECANTER FOR FLEXI CONT (MISCELLANEOUS) IMPLANT
BAND RUBBER #18 3X1/16 STRL (MISCELLANEOUS) ×2 IMPLANT
BUR EGG ELITE 5.0 (BURR) IMPLANT
BUR RND DIAMOND ELITE 4.0 (BURR) IMPLANT
CLEANER TIP ELECTROSURG 2X2 (MISCELLANEOUS) ×1 IMPLANT
CNTNR URN SCR LID CUP LEK RST (MISCELLANEOUS) ×1 IMPLANT
DRAPE LAPAROTOMY 100X72X124 (DRAPES) ×1 IMPLANT
DRAPE MICROSCOPE SLANT 54X150 (MISCELLANEOUS) ×1 IMPLANT
DRAPE SHEET LG 3/4 BI-LAMINATE (DRAPES) ×1 IMPLANT
DRAPE SURG 17X11 SM STRL (DRAPES) ×1 IMPLANT
DRAPE UTILITY 15X26 TOWEL STRL (DRAPES) IMPLANT
DRAPE UTILITY XL STRL (DRAPES) ×1 IMPLANT
DRSG AQUACEL AG ADV 3.5X 4 (GAUZE/BANDAGES/DRESSINGS) IMPLANT
DRSG AQUACEL AG ADV 3.5X 6 (GAUZE/BANDAGES/DRESSINGS) IMPLANT
DRSG AQUACEL AG ADV 3.5X10 (GAUZE/BANDAGES/DRESSINGS) IMPLANT
DRSG TELFA 3X8 NADH STRL (GAUZE/BANDAGES/DRESSINGS) IMPLANT
DURAPREP 26ML APPLICATOR (WOUND CARE) ×1 IMPLANT
DURASEAL SPINE SEALANT 3ML (MISCELLANEOUS) IMPLANT
ELECT BLADE 4.0 EZ CLEAN MEGAD (MISCELLANEOUS) IMPLANT
ELECT REM PT RETURN 9FT ADLT (ELECTROSURGICAL) ×1 IMPLANT
ELECTRODE BLDE 4.0 EZ CLN MEGD (MISCELLANEOUS) IMPLANT
ELECTRODE REM PT RTRN 9FT ADLT (ELECTROSURGICAL) ×1 IMPLANT
GLOVE BIOGEL PI IND STRL 7.5 (GLOVE) ×1 IMPLANT
GLOVE SURG SS PI 7.0 STRL IVOR (GLOVE) ×1 IMPLANT
GLOVE SURG SS PI 8.0 STRL IVOR (GLOVE) ×2 IMPLANT
GOWN STRL REUS W/ TWL LRG LVL3 (GOWN DISPOSABLE) ×1 IMPLANT
GOWN STRL REUS W/ TWL XL LVL3 (GOWN DISPOSABLE) ×1 IMPLANT
IV CATH 14GX2 1/4 (CATHETERS) ×1 IMPLANT
KIT BASIN OR (CUSTOM PROCEDURE TRAY) ×1 IMPLANT
NDL 22X1.5 STRL (OR ONLY) (MISCELLANEOUS) ×1 IMPLANT
NDL SPNL 18GX3.5 QUINCKE PK (NEEDLE) ×2 IMPLANT
NEEDLE 22X1.5 STRL (OR ONLY) (MISCELLANEOUS) ×1 IMPLANT
NEEDLE SPNL 18GX3.5 QUINCKE PK (NEEDLE) ×2 IMPLANT
PACK LAMINECTOMY NEURO (CUSTOM PROCEDURE TRAY) ×1 IMPLANT
PATTIES SURGICAL .25X.25 (GAUZE/BANDAGES/DRESSINGS) IMPLANT
PATTIES SURGICAL .75X.75 (GAUZE/BANDAGES/DRESSINGS) ×1 IMPLANT
SOLUTION PRONTOSAN WOUND 350ML (IRRIGATION / IRRIGATOR) IMPLANT
SPONGE SURGIFOAM ABS GEL 100 (HEMOSTASIS) ×1 IMPLANT
SPONGE T-LAP 4X18 ~~LOC~~+RFID (SPONGE) IMPLANT
STAPLER VISISTAT (STAPLE) IMPLANT
STRIP CLOSURE SKIN 1/2X4 (GAUZE/BANDAGES/DRESSINGS) ×1 IMPLANT
SUT NURALON 4 0 TR CR/8 (SUTURE) IMPLANT
SUT PROLENE 3 0 PS 2 (SUTURE) IMPLANT
SUT VIC AB 1 CT1 27XBRD ANTBC (SUTURE) IMPLANT
SUT VIC AB 1-0 CT2 27 (SUTURE) IMPLANT
SUT VIC AB 2-0 CT1 TAPERPNT 27 (SUTURE) IMPLANT
SUT VIC AB 2-0 CT2 27 (SUTURE) IMPLANT
SYR 3ML LL SCALE MARK (SYRINGE) ×1 IMPLANT
SYR 5ML LL (SYRINGE) IMPLANT
TOWEL GREEN STERILE (TOWEL DISPOSABLE) ×1 IMPLANT
TOWEL GREEN STERILE FF (TOWEL DISPOSABLE) ×1 IMPLANT
TRAY FOLEY MTR SLVR 16FR STAT (SET/KITS/TRAYS/PACK) ×1 IMPLANT
WIPE CHG 2% 2PK PREOPERATIVE (MISCELLANEOUS) ×1 IMPLANT
YANKAUER SUCT BULB TIP NO VENT (SUCTIONS) ×1 IMPLANT

## 2023-09-17 NOTE — Anesthesia Postprocedure Evaluation (Signed)
 Anesthesia Post Note  Patient: [name] [facility]  Procedure(s) Performed: Microdiscectomy L3-4     Patient location during evaluation: PACU Anesthesia Type: General Level of consciousness: awake and alert, oriented and patient cooperative Pain management: pain level controlled Vital Signs Assessment: post-procedure vital signs reviewed and stable Respiratory status: spontaneous breathing, nonlabored ventilation and respiratory function stable Cardiovascular status: blood pressure returned to baseline and stable Postop Assessment: no apparent nausea or vomiting Anesthetic complications: no   No notable events documented.  Last Vitals:  Vitals:   09/17/23 1300 09/17/23 1407  BP: 135/81 (!) 162/89  Pulse: 87 90  Resp: 19 18  Temp:  36.6 C  SpO2: 94% 96%    Last Pain:  Vitals:   09/17/23 1532  TempSrc:   PainSc: 7                  Lannie Fields

## 2023-09-17 NOTE — Interval H&P Note (Signed)
 History and Physical Interval Note:  09/17/2023 8:28 AM  [name]  has presented today for surgery, with the diagnosis of Herniated disc L3-4.  The various methods of treatment have been discussed with the patient and family. After consideration of risks, benefits and other options for treatment, the patient has consented to  Procedure(s) with comments: Microdiscectomy L3-4 (N/A) - 3 C-Bed as a surgical intervention.  The patient's history has been reviewed, patient examined, no change in status, stable for surgery.  I have reviewed the patient's chart and labs.  Questions were answered to the patient's satisfaction.     [name] C [name] Right DF, EHL 4/5 SLR + bilat. Has had Amoxicillin in past ok. Will use Kefzol Since Vanco and Gent not started yet.

## 2023-09-17 NOTE — Anesthesia Procedure Notes (Signed)
 Procedure Name: Intubation Date/Time: 09/17/2023 8:51 AM  Performed by: Darlina Guys, CRNAPre-anesthesia Checklist: Patient identified, Emergency Drugs available, Suction available and Patient being monitored Patient Re-evaluated:Patient Re-evaluated prior to induction Oxygen Delivery Method: Circle System Utilized Preoxygenation: Pre-oxygenation with 100% oxygen Induction Type: IV induction Ventilation: Oral airway inserted - appropriate to patient size Laryngoscope Size: Glidescope and 4 Grade View: Grade I Tube type: Oral Tube size: 7.5 mm Number of attempts: 1 Airway Equipment and Method: Stylet and Oral airway Placement Confirmation: ETT inserted through vocal cords under direct vision, positive ETCO2 and breath sounds checked- equal and bilateral Secured at: 23 cm Tube secured with: Tape Dental Injury: Teeth and Oropharynx as per pre-operative assessment  Comments: Atraumatic induction/intubation. Dentition and oral mucosa as per preop.

## 2023-09-17 NOTE — Transfer of Care (Signed)
 Immediate Anesthesia Transfer of Care Note  Patient: [employer] [facility]  Procedure(s) Performed: Microdiscectomy L3-4  Patient Location: PACU  Anesthesia Type:General  Level of Consciousness: awake and patient cooperative  Airway & Oxygen Therapy: Patient Spontanous Breathing and Patient connected to face mask oxygen  Post-op Assessment: Report given to RN and Post -op Vital signs reviewed and stable  Post vital signs: Reviewed and stable  Last Vitals:  Vitals Value Taken Time  BP 155/76 09/17/23 1208  Temp    Pulse 119 09/17/23 1213  Resp 28 09/17/23 1213  SpO2 97 % 09/17/23 1213  Vitals shown include unfiled device data.  Last Pain:  Vitals:   09/17/23 0659  TempSrc: Oral         Complications: No notable events documented.

## 2023-09-17 NOTE — Evaluation (Signed)
 Occupational Therapy Evaluation Patient Details Name: [name] MRN: [id number] DOB: 1985/11/18 Today's Date: 09/17/2023   History of Present Illness   38 yo M s/p decompression.  Prior back surgeries.     Clinical Impressions Patient admitted for additional lumbar decompression.  Presenting at his baseline with no deficits noted to ADL and mobility.  Patient aware of log roll and back precautions, no further OT needs in the acute setting.  Recommend follow up as prescribed by MD.       If plan is discharge home, recommend the following:   Assist for transportation     Functional Status Assessment   Patient has not had a recent decline in their functional status     Equipment Recommendations   None recommended by OT     Recommendations for Other Services         Precautions/Restrictions   Precautions Precautions: Back Precaution Booklet Issued: Yes (comment) Recall of Precautions/Restrictions: Intact Restrictions Weight Bearing Restrictions Per Provider Order: No     Mobility Bed Mobility Overal bed mobility: Modified Independent                  Transfers Overall transfer level: Modified independent Equipment used: None                      Balance Overall balance assessment: Mild deficits observed, not formally tested                                         ADL either performed or assessed with clinical judgement   ADL                                               Vision Patient Visual Report: No change from baseline       Perception Perception: Not tested       Praxis Praxis: Not tested       Pertinent Vitals/Pain Pain Assessment Pain Assessment: Faces Faces Pain Scale: Hurts little more Pain Location: Incisional Pain Descriptors / Indicators: Sore Pain Intervention(s): Monitored during session     Extremity/Trunk Assessment Upper Extremity Assessment Upper  Extremity Assessment: Overall WFL for tasks assessed   Lower Extremity Assessment Lower Extremity Assessment: Overall WFL for tasks assessed   Cervical / Trunk Assessment Cervical / Trunk Assessment: Back Surgery   Communication Communication Communication: No apparent difficulties   Cognition Arousal: Alert Behavior During Therapy: WFL for tasks assessed/performed Cognition: No apparent impairments                               Following commands: Intact       Cueing  General Comments   Cueing Techniques: Verbal cues   VSS on RA   Exercises     Shoulder Instructions      Home Living Family/patient expects to be discharged to:: Private residence Living Arrangements: Spouse/significant other Available Help at Discharge: Family;Available 24 hours/day Type of Home: House Home Access: Stairs to enter [employer] of Steps: 1 Entrance Stairs-Rails: None Home Layout: One level     Bathroom Shower/Tub: Tub/shower unit;Walk-in shower   Bathroom Toilet: Standard Bathroom Accessibility: Yes How Accessible: Accessible via walker Home Equipment:  BSC/3in1          Prior Functioning/Environment Prior Level of Function : Independent/Modified Independent;Working/employed;Driving                    OT Problem List: Impaired balance (sitting and/or standing);Decreased activity tolerance   OT Treatment/Interventions:        OT Goals(Current goals can be found in the care plan section)   Acute Rehab OT Goals Patient Stated Goal: Return home OT Goal Formulation: With patient Time For Goal Achievement: 09/19/23 Potential to Achieve Goals: Good   OT Frequency:       Co-evaluation              AM-PAC OT "6 Clicks" Daily Activity     Outcome Measure Help from another person eating meals?: None Help from another person taking care of personal grooming?: None Help from another person toileting, which includes using toliet,  bedpan, or urinal?: None Help from another person bathing (including washing, rinsing, drying)?: None Help from another person to put on and taking off regular upper body clothing?: None Help from another person to put on and taking off regular lower body clothing?: None 6 Click Score: 24   End of Session Nurse Communication: Mobility status  Activity Tolerance: Patient tolerated treatment well Patient left: in bed;with call bell/phone within reach  OT Visit Diagnosis: Unsteadiness on feet (R26.81)                Time: 3244-0102 OT Time Calculation (min): 21 min Charges:  OT General Charges $OT Visit: 1 Visit OT Evaluation $OT Eval Moderate Complexity: 1 Mod  09/17/2023  RP, OTR/L  Acute Rehabilitation Services  Office:  [phone]   Suzanna Obey 09/17/2023, 3:55 PM

## 2023-09-17 NOTE — Brief Op Note (Addendum)
 09/17/2023  8:30 AM  PATIENT:  Ritta Slot  38 y.o. male  PRE-OPERATIVE DIAGNOSIS:  Herniated disc L3-4  POST-OPERATIVE DIAGNOSIS:  * No post-op diagnosis entered *  PROCEDURE:  Procedure(s) with comments: Microdiscectomy L3-4 (N/A) - 3 C-Bed  SURGEON:  Surgeons and Role:    Jene Every, MD - Primary  PHYSICIAN ASSISTANT:   ASSISTANTS: Bissell   ANESTHESIA:   general  EBL:  50  BLOOD ADMINISTERED:none  DRAINS: none   LOCAL MEDICATIONS USED:  MARCAINE     SPECIMEN:  No Specimen  DISPOSITION OF SPECIMEN:  N/A  COUNTS:  YES  TOURNIQUET:  * No tourniquets in log *  DICTATION: .Other Dictation: Dictation Number [id number]     PLAN OF CARE: Admit for overnight observation  PATIENT DISPOSITION:  PACU - hemodynamically stable.   Delay start of Pharmacological VTE agent (>24hrs) due to surgical blood loss or risk of bleeding: yes

## 2023-09-17 NOTE — Discharge Instructions (Signed)

## 2023-09-17 NOTE — Op Note (Unsigned)
 [name], [name] MEDICAL RECORD NO: [id number] ACCOUNT NO: [id number] DATE OF BIRTH: 11/17/1985 FACILITY: [facility] LOCATION: [facility] PHYSICIAN: Javier Docker, MD  Operative Report   DATE OF PROCEDURE: 09/17/2023  PREOPERATIVE DIAGNOSES: 1.  Spinal stenosis. 2.  Herniated nucleus pulposus L3-L4. 3.  Elevated BMI of 41.25.  POSTOPERATIVE DIAGNOSES: 1.  Spinal stenosis. 2.  Herniated nucleus pulposus L3-L4. 3.  Elevated BMI of 41.25.  PROCEDURE PERFORMED: 1.  Central laminectomy of L3-L4 with bilateral hemilaminotomies and foraminotomies of L4. 2.  Microdiscectomy L3-L4 bilaterally.  ANESTHESIA:  General.  ASSISTANT:  Lanna Poche, PA  Technical difficulty increased due to the patient's elevated BMI.  HISTORY:  A 38 year old male with right lower extremity and left lower extremity radicular pain at L4-L5 nerve root distribution.  He had dorsiflexion weakness on the right and had a large extruded fragment at L3-L4 as he has a transitional segment at  L5-S1 the second to the last open disc space.  He was indicated for decompression of the nerve roots by a central decompression and bilateral hemilaminotomies as there was a central herniation with severe compression of the thecal sac.  Risks and  benefits were discussed including bleeding, infection, damage to neurovascular structures, no change in symptoms, worsening symptoms, DVT, PE, anesthetic complicated, etc.  TECHNIQUE:  The patient in supine position after induction of adequate general anesthesia, 3 grams of Kefzol, the patient was placed on the Wilson frame with all bony prominences well padded.  He was positioned without increasing the flexion due to the  patient's weight.  After careful positioning, the lumbar region was prepped and draped in the usual sterile fashion.  Prior to that, two 18 gauge  spinal needles were utilized to localize the L3-L4, L4-L5 interspace.  X-ray was utilized for confirmation;  however,  the first three x-rays were less than diagnostic with suboptimal penetration.  We were able to identify at least anteriorly the disc spaces. I indicated they would require a different x-ray machine for further evaluation.  As they went to  search, we made our incision from the apparent spinous process of L3 to L5.  Subcutaneous tissue was dissected.  Electrocautery was utilized for hemostasis.  Dorsal lumbar fascia divided in line with skin incision.  Paraspinous muscles elevated from a  lamina of L3 and L4.  McCulloch retractor was placed.  The patient had a small interlaminar window.  I removed the spinous process of L4 and what was apparently the spinous process of L3.  Electrocautery was utilized to achieve hemostasis.  Bone wax was  placed on the cancellous surfaces.  After identification and inspection, the alternate portable x-ray device was secured.  We obtained a fourth lateral x-ray.  We were able to delineate the spinous processes and the initial Kochers were on the spinous  process of L2 and L3.  We therefore extended our incision to include L3 and L4, and elevated the paraspinous musculature, repositioned our McCulloch retractor.  I removed the partial spinous process of L4 and the interspinous ligament.  This identifying  the lamina of L3 and L4.  A fairly small interlaminar window.  I used Leksell rongeur to partially remove the inferior aspect of the lamina L4 bilaterally.  I used a straight curette to detach ligamentum flavum from the caudad edge of L3 and used a  straight curette to detach the ligamentum flavum from the cephalad edge of L4.  I continued with the hemilaminotomy cephalad with a 2 mm Kerrison to the point  attached to ligamentum flavum centrally and centrally to the right.  I used a Penfield to enter  the epidural space.  I identified epidural fat.  I then used the Laurel to protect the epidural fat and identify the ligamentum flavum.  I began removing the ligamentum flavum  first from the right, detached in the caudad edge of L3 and then on the  left-hand side.  There was severe compression of the thecal sac noted.  I continued then removing ligamentum flavum centrally, caudad to the cephalad edge of L4 and then completed removing ligamentum flavum bilaterally without compression of the thecal  sac or patties, just enough to protect the dura.  After removing the ligamentum flavum, I then turned attention onto the right.  I then decompressed the lateral recess to medial border of the pedicle.  I identified the shoulder of the root of L4.  I  removed ligamentum flavum.  I performed a foraminotomy of L4.  Then following this, I was able to take a small portion of the extruded fragment out laterally.  This was further mobilized with a nerve hook, translating the disc fragment laterally and  inferiorly.  A large fragment was excised, which allowed [place] to mobilize the thecal sac slightly with D'Errico.  Three additional large fragments were recovered that had migrated caudad and medially.  I then performed an annulotomy and copious portions of  disc materials was removed from the disc space with a straight and upbiting pituitary to further mobilize with catheter lavage.  Distal fragment was excised.  I checked out the foramen of L3 and down L4.  There was no residual herniation.  I then looked  on the contralateral side, gently mobilizing the thecal sac medially, performed a foraminotomy of L4, protecting the L4 root.  Finding the shoulder of the root, I gently mobilized it medially.  A central portion of the disc herniation was noted.  I  performed an additional annulotomy.  Copious portion of disc material was removed with a straight pituitary and upbiting pituitary, further mobilized with a Woodson and a nerve hook and catheter lavage.  Following this, Woodson probe passed freely at the  foramen of L4 and L3 and above the pedicle of L3.  I then checked again on the right side.   There were no further fragments noted or herniation and Woodson probe passed freely above the pedicle of L3 and below L4.  Bipolar cautery had been utilized to  achieve hemostasis.  There was good restoration of the thecal sac without compression.  There was 1 cm of excursion of each L4 nerve root without tension noted bilaterally.  Copious irrigation and Thrombin soaked Gelfoam was placed and then removed.   Inspection revealed no evidence of CSF leakage or active bleeding.  I placed bone wax on the cancellous surfaces.  Intraoperatively the microscope oculars were readjusted.  After removal of the McCulloch retractor and copious irrigations, we achieved strict hemostasis with bipolar electrocautery.  Inspection done cephalad and caudad.  No active bleeding or CSF leakage.  Gelfoam had been removed.  Without active bleeding, I  closed the dorsal lumbar fashion with 1 Vicryl interrupted figure-of-eight sutures, subcutaneous with multiple layers of 2-0 and then the skin with staples.  The wound was dressed sterilely.  The patient was placed supine on the hospital bed.  In-and-out  catheterization was performed and he was transported to the recovery room in satisfactory condition.   The patient tolerated the procedure well and no complications.  Assistant, Lanna Poche, PA was used throughout the case in patient positioning, gentle intermittent neural traction, suction, and closure.  Again technical difficulty increased due to the patient's elevated BMI.  We had to use the extra long retractors and instruments.  EBL 50 cc  VAI D: 09/17/2023 12:04:51 pm T: 09/17/2023 10:53:00 pm  JOB: [id number]/ [id number]

## 2023-09-17 NOTE — Plan of Care (Signed)
 Pt doing well. Pt and wife given D/C instructions with verbal understanding. Rx's were sent to the pharmacy by MD. Pt's incision is clean and dry with no sign of infection. Pt's IV was removed prior to D/C. Pt D/C'd home via wheelchair per MD order. Pt is stable @ D/C and has no other needs at this time. Rema Fendt, RN

## 2023-09-18 ENCOUNTER — Encounter (HOSPITAL_COMMUNITY): Payer: Self-pay | Admitting: Specialist

## 2023-10-15 NOTE — Telephone Encounter (Signed)
 Inbound call from [facility] requesting a verbal order as well as orders faxed to [phone].   Advising if patient can have infusion over an hour.   Please advise. Thank you

## 2023-10-15 NOTE — Telephone Encounter (Signed)
 Form faxed as requested. No phone number left to provide verbal order.

## 2024-08-09 ENCOUNTER — Ambulatory Visit: Admitting: Gastroenterology

## 2024-08-11 NOTE — Progress Notes (Unsigned)
 "     Jeffrey Console, PA-C [address] [place], [place]  [address] Phone: [phone]   Primary Care Physician: Holmes Daved Fritz, FNP  Primary Gastroenterologist:  Jeffrey Console, PA-C / Elspeth Naval, MD   Chief Complaint: Follow-up ulcerative pancolitis and MASLD   HPI:   Discussed the use of AI scribe software for clinical note transcription with the patient, who gave verbal consent to proceed.  39 year old male, established patient Dr. Naval, presents for follow-up of ulcerative pancolitis.  He last saw Dr. Naval for follow-up visit 08/2022.  Previous patient of Dr. Teressa.  Initially diagnosed with pan ulcerative colitis in 2017.  Treated with Lialda  which did not control his disease.  Transitioned to Remicade  and azathioprine  which controlled his disease.  When he last saw Dr. Naval 08/2022 azathioprine  was discontinued.  Currently he remains on Remicade  monotherapy and is doing well.  He is currently taking a biosimilar Inflectra  (infliximab ) 5 mg/kg IV every 8 weeks.  01/2023 last colonoscopy by Dr. Naval: Normal terminal ileum.  Pseudopolyps throughout the colon.  No active inflammation.  Internal hemorrhoids.  Biopsies showed no dysplasia or active inflammation.   Has history of chronic elevated ALT liver transaminase attributed to fatty liver. 08/2023 last labs showed elevated AST 43, ALT 79. No recent liver imaging.  History of Present Illness [name] is a 39 year old male with ulcerative pancolitis and fatty liver who presents for routine follow-up and to address a delay in his Inflectra  infusion.  Ulcerative Pancolitis Disease Activity and Management: - Maintained on Inflectra  infusions every eight weeks; most recent infusion was delayed to approximately eleven to twelve weeks due to an ear infection requiring antibiotics and insurance requirements for updated documentation - Previously treated with Lialda , Remicade , and  azathioprine  (azathioprine  discontinued) - No abdominal pain, diarrhea, rectal bleeding, or other gastrointestinal symptoms since starting Remicade /Inflectra  - No signs of disease flare; describes condition as great - No current symptoms of infection or adverse effects from immunosuppression  Hepatic Laboratory Abnormalities and Fatty Liver: - History of mildly elevated liver enzymes attributed to fatty liver - Recent labs (August 04, 2024): ALT 63 (slightly elevated), normal AST, alkaline phosphatase, and total bilirubin - Liver enzymes have shown slight improvement over time - Liver ultrasound performed five to six years ago - Regular monitoring of liver enzymes and other labs through primary care provider - Complete blood count and thyroid function tests were normal in May 2025  Immunization Status: - Received TDAP booster last week - Up to date on vaccinations, including pneumococcal vaccine     Current Outpatient Medications  Medication Sig Dispense Refill   citalopram  (CELEXA ) 20 MG tablet Take 20 mg by mouth daily.     inFLIXimab -dyyb (INFLECTRA ) 100 MG SOLR 5 mg/kg IV every 8 weeks Infused at [facility] (phone [phone], fax [phone]) 1 each 11   lisinopril  (ZESTRIL ) 20 MG tablet TAKE 1 TABLET BY MOUTH EVERY DAY 30 tablet 0   OZEMPIC, 2 MG/DOSE, 8 MG/3ML SOPN Inject 2 mg into the skin every Tuesday.     No current facility-administered medications for this visit.    Allergies as of 08/12/2024 - Review Complete 08/12/2024  Allergen Reaction Noted   Sulfa antibiotics Other (See Comments) 02/06/2016   Penicillin g Hives, Itching, and Rash 01/14/2023   Penicillins Hives, Itching, and Rash 02/06/2016    Past Medical History:  Diagnosis Date   Diabetes mellitus without complication (HCC)    on meds  Fatty liver    Hypertension    on meds   Morbid obesity (HCC)    Panic anxiety syndrome    on meds   Ulcerative pancolitis [facility])     Past Surgical  History:  Procedure Laterality Date   COLONOSCOPY N/A 05/24/2016   Procedure: COLONOSCOPY;  Surgeon: Toribio SHAUNNA Cedar, MD;  Location: [facility];  Service: Endoscopy;  Laterality: N/A;   ESOPHAGOGASTRODUODENOSCOPY N/A 05/24/2016   Procedure: ESOPHAGOGASTRODUODENOSCOPY (EGD);  Surgeon: Toribio SHAUNNA Cedar, MD;  Location: [facility];  Service: Endoscopy;  Laterality: N/A;   LASIK  2020   LUMBAR LAMINECTOMY/DECOMPRESSION MICRODISCECTOMY N/A 02/14/2016   Procedure: MICRO  LUMBER L4-5;  Surgeon: Reyes Billing, MD;  Location: [facility];  Service: Orthopedics;  Laterality: N/A;   LUMBAR LAMINECTOMY/DECOMPRESSION MICRODISCECTOMY N/A 09/17/2023   Procedure: Microdiscectomy L3-4;  Surgeon: Billing Reyes, MD;  Location: [facility];  Service: Orthopedics;  Laterality: N/A;  3 C-Bed    Review of Systems:    All systems reviewed and negative except where noted in HPI.    Physical Exam:  BP (!) 128/90   Pulse 91   Ht 6' 2 (1.88 m)   Wt (!) 342 lb 4 oz (155.2 kg)   BMI 43.94 kg/m  No LMP for male patient.  General: Well-nourished, well-developed, morbidly obese, in no acute distress.  Lungs: Clear to auscultation bilaterally. Non-labored. Heart: Regular rate and rhythm, no murmurs rubs or gallops.  Abdomen: Bowel sounds are normal; Abdomen is Soft and obese; No hepatosplenomegaly, masses or hernias;  No Abdominal Tenderness; No guarding or rebound tenderness. Neuro: Alert and oriented x 3.  Grossly intact.  Psych: Alert and cooperative, normal mood and affect.  Imaging Studies: No results found.  Labs: CBC    Component Value Date/Time   WBC 11.9 (H) 09/11/2023 1400   RBC 5.45 09/11/2023 1400   HGB 16.3 09/11/2023 1400   HCT 47.5 09/11/2023 1400   PLT 337 09/11/2023 1400   MCV 87.2 09/11/2023 1400   MCH 29.9 09/11/2023 1400   MCHC 34.3 09/11/2023 1400   RDW 12.6 09/11/2023 1400   LYMPHSABS 3.6 08/26/2022 0821   MONOABS 0.7 08/26/2022 0821   EOSABS 0.1 08/26/2022 0821   BASOSABS 0.0  08/26/2022 0821    CMP  08/04/24 normal CMP labs through [facility] (PCP): BUN 14, creatinine 0.83, sodium 140, potassium 4.6, total bilirubin 0.5, alk phos 49, AST 40, ALT 63.     Component Value Date/Time   NA 138 09/11/2023 1400   K 3.9 09/11/2023 1400   CL 103 09/11/2023 1400   CO2 22 09/11/2023 1400   GLUCOSE 85 09/11/2023 1400   BUN 14 09/11/2023 1400   CREATININE 0.94 09/11/2023 1400   CALCIUM  9.4 09/11/2023 1400   PROT 7.2 09/11/2023 1400   ALBUMIN  4.1 09/11/2023 1400   AST 43 (H) 09/11/2023 1400   ALT 79 (H) 09/11/2023 1400   ALKPHOS 37 (L) 09/11/2023 1400   BILITOT 0.6 09/11/2023 1400   GFRNONAA >60 09/11/2023 1400   GFRAA >60 05/23/2016 0314     Assessment and Plan:   Jerren Flinchbaugh is a 39 y.o. y/o male returns for annual follow-up of:  1.  Pan ulcerative colitis, originally diagnosed in 2017. Appears to be in clinical and endoscopic remission on infliximab  (Inflectra ). - Continue infliximab  (Inflectra ) 5 mg/kg IV every 8 weeks. - Labs CBC, CRP, CMP, infliximab  drug level and antibody, Quantaferron TB Gold - Fecal calprotectin - Continue infliximab  IV every 8 weeks  2.  Elevated liver transaminases: Improved - Recent lab 08/04/2024 showed mildly elevated ALT 63 (improved).  All other LFTs normal. - He will continue to have his LFTs monitored through his PCP every 6 to 12 months.  3.  MASLD - Order RUQ liver ultrasound with elastography.  Evaluate for fibrosis. - Counseled on weight loss, low fat, low carbohydrate diet, minimizing sugar intake. - Emphasized regular exercise. - Reviewed Birch.brandt and Rezdiffra indications for MASLD with F2 or F3 fibrosis, not typically covered for F1 fibrosis.  Assessment & Plan Ulcerative pancolitis Ulcerative pancolitis in clinical remission on maintenance infliximab , no symptoms or flare despite delayed infusion. - Ordered infliximab  level, TB test, and fecal calprotectin to monitor therapy, safety, and assess for  subclinical inflammation. - Documented efficacy of Inflectra /Remicade  and remission status for insurance approval for resuming infusions. - Placed orders to resume Inflectra  infusions for maintenance therapy. - Provided anticipatory guidance regarding annual monitoring and follow-up in one year.  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 - Ordered liver ultrasound with elastography to assess hepatic fibrosis and monitor MASLD progression. - Reviewed recent liver function tests; did not repeat labs already performed by PCP. - Provided counseling on low fat, low carbohydrate diet, regular exercise, and weight loss to reverse MASLD. - Discussed new medications (Wegovy, Rezdiffra) for MASLD, noting insurance coverage depends on fibrosis stage (F2/F3). - Arranged follow-up to review imaging and ongoing management.  Long-term immunosuppressive therapy Continues long-term infliximab  with no infection or adverse effects; immunizations, including recent TDAP booster, are up to date. - Reviewed and confirmed up-to-date vaccination status, including TDAP booster. - Provided education on maintaining immunizations, including pneumococcal vaccine, due to immunosuppression. - Ordered TB test as part of routine safety monitoring for infliximab  therapy.   Jeffrey Console, PA-C  Follow up in 1 year.  Also follow-up based on above test results.   "

## 2024-08-12 ENCOUNTER — Other Ambulatory Visit (INDEPENDENT_AMBULATORY_CARE_PROVIDER_SITE_OTHER)

## 2024-08-12 ENCOUNTER — Encounter: Payer: Self-pay | Admitting: Physician Assistant

## 2024-08-12 ENCOUNTER — Telehealth: Payer: Self-pay | Admitting: Physician Assistant

## 2024-08-12 ENCOUNTER — Ambulatory Visit (INDEPENDENT_AMBULATORY_CARE_PROVIDER_SITE_OTHER): Admitting: Physician Assistant

## 2024-08-12 VITALS — BP 128/90 | HR 91 | Ht 74.0 in | Wt 342.2 lb

## 2024-08-12 DIAGNOSIS — R748 Abnormal levels of other serum enzymes: Secondary | ICD-10-CM | POA: Diagnosis not present

## 2024-08-12 DIAGNOSIS — K760 Fatty (change of) liver, not elsewhere classified: Secondary | ICD-10-CM | POA: Diagnosis not present

## 2024-08-12 DIAGNOSIS — Z7960 Long term (current) use of unspecified immunomodulators and immunosuppressants: Secondary | ICD-10-CM

## 2024-08-12 DIAGNOSIS — K5100 Ulcerative (chronic) pancolitis without complications: Secondary | ICD-10-CM

## 2024-08-12 DIAGNOSIS — R7989 Other specified abnormal findings of blood chemistry: Secondary | ICD-10-CM

## 2024-08-12 LAB — CBC WITH DIFFERENTIAL/PLATELET
Basophils Absolute: 0 10*3/uL (ref 0.0–0.1)
Basophils Relative: 0.3 % (ref 0.0–3.0)
Eosinophils Absolute: 0.1 10*3/uL (ref 0.0–0.7)
Eosinophils Relative: 1.6 % (ref 0.0–5.0)
HCT: 43.7 % (ref 39.0–52.0)
Hemoglobin: 15 g/dL (ref 13.0–17.0)
Lymphocytes Relative: 37.1 % (ref 12.0–46.0)
Lymphs Abs: 3.4 10*3/uL (ref 0.7–4.0)
MCHC: 34.3 g/dL (ref 30.0–36.0)
MCV: 86.9 fl (ref 78.0–100.0)
Monocytes Absolute: 0.8 10*3/uL (ref 0.1–1.0)
Monocytes Relative: 8.9 % (ref 3.0–12.0)
Neutro Abs: 4.7 10*3/uL (ref 1.4–7.7)
Neutrophils Relative %: 52.1 % (ref 43.0–77.0)
Platelets: 311 10*3/uL (ref 150.0–400.0)
RBC: 5.03 Mil/uL (ref 4.22–5.81)
RDW: 12.9 % (ref 11.5–15.5)
WBC: 9 10*3/uL (ref 4.0–10.5)

## 2024-08-12 LAB — C-REACTIVE PROTEIN: CRP: <0.5 mg/dL — ABNORMAL LOW (ref 1.0–20.0)

## 2024-08-12 NOTE — Patient Instructions (Signed)
 Your provider has requested that you go to the basement level for lab work before leaving today. Press B on the elevator. The lab is located at the first door on the left as you exit the elevator.  Due to recent changes in healthcare laws, you may see the results of your imaging and laboratory studies on MyChart before your provider has had a chance to review them.  We understand that in some cases there may be results that are confusing or concerning to you. Not all laboratory results come back in the same time frame and the provider may be waiting for multiple results in order to interpret others.  Please give us  48 hours in order for your provider to thoroughly review all the results before contacting the office for clarification of your results.   You have been scheduled for an abdominal ultrasound at [facility] (1st floor of hospital) on 08/18/2024 at 8:00am. Please arrive 30 minutes prior to your appointment for registration. Make certain not to have anything to eat or drink 6 hours prior to your appointment. Should you need to reschedule your appointment, please contact radiology at [phone]. This test typically takes about 30 minutes to perform.  I appreciate the opportunity to care for you. Ellouise Console, PA

## 2024-08-12 NOTE — Telephone Encounter (Signed)
 Patient needs refill of Inflectra  (infliximab ) IV infusion every 8 weeks sent to [facility] for ulcerative colitis. Ellouise Console, PA-C

## 2024-08-12 NOTE — Progress Notes (Signed)
 Agree with assessment and plan as outlined.

## 2024-08-13 NOTE — Telephone Encounter (Signed)
 Orders completed & placed on [name]'s desk for signature.

## 2024-08-15 LAB — QUANTIFERON-TB GOLD PLUS
Mitogen-NIL: 7.64 [IU]/mL
NIL: 0.02 [IU]/mL
QuantiFERON-TB Gold Plus: NEGATIVE
TB1-NIL: 0.01 [IU]/mL
TB2-NIL: 0.03 [IU]/mL

## 2024-08-16 ENCOUNTER — Ambulatory Visit: Payer: Self-pay | Admitting: Physician Assistant

## 2024-08-17 NOTE — Telephone Encounter (Signed)
 Received call from [facility] regarding plan of treatment they received on pt. They are faxing it back to the office as there are some items that need to be clarified and they do not have an  NPI number for Jeffrey Console PA. Requested they fax the form to [phone] Attn:  Melissa.

## 2024-08-18 ENCOUNTER — Ambulatory Visit (HOSPITAL_COMMUNITY)

## 2024-08-18 NOTE — Telephone Encounter (Signed)
 Spoke with Griselda with [facility]. She states [facility] did not need a renewal order as the one they have currently from Dr Leigh from 11/25 & good until 11/26. They received a renewal from us  so that halted the patient's treatment & his infusion had to be canceled. Per Terence, she can take a verbal to continue the previous order from Nov 2025 & cancel this order. Order given to continue with previous order from Dr Leigh.

## 2024-08-19 LAB — SERIAL MONITORING

## 2024-08-20 LAB — INFLIXIMAB+AB (SERIAL MONITOR)
Anti-Infliximab Antibody: <22 ng/mL
Infliximab Drug Level: 6.8 ug/mL
